# Patient Record
Sex: Male | Born: 1960 | Race: White | Hispanic: No | Marital: Married | Smoking: Never smoker
Health system: Southern US, Community
[De-identification: ages and names within clinical notes are randomized; demographics above are authoritative.]

## PROBLEM LIST (undated history)

## (undated) DIAGNOSIS — E119 Type 2 diabetes mellitus without complications: Secondary | ICD-10-CM

## (undated) DIAGNOSIS — E785 Hyperlipidemia, unspecified: Secondary | ICD-10-CM

## (undated) DIAGNOSIS — I10 Essential (primary) hypertension: Secondary | ICD-10-CM

## (undated) HISTORY — DX: Type 2 diabetes mellitus without complications: E11.9

## (undated) HISTORY — PX: NO PAST SURGERIES: SHX2092

## (undated) HISTORY — DX: Essential (primary) hypertension: I10

## (undated) HISTORY — DX: Hyperlipidemia, unspecified: E78.5

---

## 2015-05-08 ENCOUNTER — Ambulatory Visit (INDEPENDENT_AMBULATORY_CARE_PROVIDER_SITE_OTHER): Payer: Self-pay

## 2015-05-08 ENCOUNTER — Ambulatory Visit
Admission: EM | Admit: 2015-05-08 | Discharge: 2015-05-08 | Disposition: A | Discharge status: Home / Self Care | Payer: Self-pay | Attending: Emergency Medicine | Admitting: Emergency Medicine

## 2015-05-08 ENCOUNTER — Encounter: Payer: Self-pay | Admitting: *Deleted

## 2015-05-08 DIAGNOSIS — L03032 Cellulitis of left toe: Secondary | ICD-10-CM

## 2015-05-08 MED ORDER — CEFTRIAXONE SODIUM 1 G IJ SOLR
1.0000 g | Freq: Once | INTRAMUSCULAR | Status: AC
  Administered 2015-05-08: 1 g via INTRAMUSCULAR

## 2015-05-08 MED ORDER — SULFAMETHOXAZOLE-TRIMETHOPRIM 800-160 MG PO TABS: 2.0000 | ORAL_TABLET | Freq: Two times a day (BID) | ORAL | Status: DC

## 2015-05-08 NOTE — Discharge Instructions (Signed)
Follow-up here in 2 days for wound recheck. You need to be seen by a primary care physician. I'm concerned about your blood pressure today, and I'm concerned that you may have diabetes. Go to the ER if you get worse, for pain, for any black discoloration, for odor, if you start having fevers, or other concerns

## 2015-05-08 NOTE — ED Notes (Signed)
Patient reports that a blister developed on his left big toe on 05/03/15. After the blister burst the area around the big toe has become swollen, red, and with drainage present. Patient states that he has not been to a PCP in 20 years.

## 2015-05-08 NOTE — ED Provider Notes (Signed)
HPI  SUBJECTIVE:  [name] [name] is a 54 y.o. male who presents with a blister on the plantar aspect at the base of his first left toe starting 6 days ago. States that it burst, and then was followed by a erythema, edema, mild pain. States that the redness and swelling was extending up to his mid foot several days ago, but has gotten better with some rest. He reports clear yellow drainage. Reports second blister on the dorsum of his toe several days ago. Symptoms are better with rest, worse with walking. He tried antibiotic ointment, peroxide, Epsom salt soaks. No nausea, vomiting, fevers, trauma to the area, paresthesias. He reports 1 day of chills for days ago, this has since resolved. Denies body aches, odor coming from the wound. No antipyretic in the past 4-6 hours. Patient denies any history of diabetes, hypertension. He is not a smoker.   History reviewed. No pertinent past medical history.  History reviewed. No pertinent past surgical history.  History reviewed. No pertinent family history.  Social History  Substance Use Topics  . Smoking status: Never Smoker   . Smokeless tobacco: Never Used  . Alcohol Use: Yes    No current facility-administered medications for this encounter.  Current outpatient prescriptions:  .  sulfamethoxazole-trimethoprim (BACTRIM DS,SEPTRA DS) 800-160 MG tablet, Take 2 tablets by mouth 2 (two) times daily., Disp: 40 tablet, Rfl: 0  No Known Allergies   ROS  As noted in HPI.   Physical Exam  BP 177/97 mmHg  Pulse 95  Temp(Src) 97.4 F (36.3 C) (Oral)  Resp 18  Ht 6\' 1"  (1.854 m)  Wt 275 lb (124.739 kg)  BMI 36.29 kg/m2  SpO2 100%  Constitutional: Well developed, well nourished, no acute distress Eyes:  EOMI, conjunctiva normal bilaterally HENT: Normocephalic, atraumatic,mucus membranes moist Respiratory: Normal inspiratory effort Cardiovascular: Normal rate GI: nondistended skin: No rash, skin intact Musculoskeletal: Erythema, edema  extending to the first MTP. Mild diffuse tenderness. No bony tenderness. 2 lesions 1.5 x 1 cm nontender blister on the plantar surface of his foot, 3 x 1 cm nontender blister on the dorsum at the PIP. 2 point discrimination intact and equal in all toes. DP 2+.  Neurologic: Alert & oriented x 3, no focal neuro deficits Psychiatric: Speech and behavior appropriate   ED Course   Medications  cefTRIAXone (ROCEPHIN) injection 1 g (1 g Intramuscular Given 05/08/15 1523)    Orders Placed This Encounter  Procedures  . DG Toe Great Left    Standing Status: Standing     Number of Occurrences: 1     Standing Expiration Date:     Order Specific Question:  Reason for Exam (SYMPTOM  OR DIAGNOSIS REQUIRED)    Answer:  r/o subcu air, osteomyelitis    No results found for this or any previous visit (from the past 24 hour(s)). Dg Toe Great Left  05/08/2015  CLINICAL DATA:  Possible subcutaneous air, osteomyelitis great toe EXAM: LEFT GREAT TOE COMPARISON:  None. FINDINGS: Four views of the left first second and third toes submitted. No acute fracture or subluxation. There is diffuse soft tissue swelling left great toe. No bone destruction or bony erosion to suggest osteomyelitis. No periosteal reaction. No radiopaque foreign body. No definite evidence of subcutaneous air. IMPRESSION: No acute fracture or subluxation. No definite evidence of osteomyelitis. Electronically Signed   By: Natasha MeadLiviu  Pop M.D.   On: 05/08/2015 15:53          ED Clinical Impression  Cellulitis  of toe of left foot   ED Assessment/Plan Obtaining x-ray to rule out osteomyelitis. Giving 1 g Rocephin. Marked area of erythema with marker for reference. Will have patient return in 48 hours for wound recheck. Consider sending to the ER if not improving significantly on reevaluation. Home on Bactrim. Will give patient primary care referrals for follow-up. Giving strict ER return precautions.   Reviewed  imaging independently. No  subcutaneous air, osteomyelitis.  See radiology report for full details.  Discussed imaging, MDM, plan and followup with patient. Discussed sn/sx that should prompt return to the UC or ED. Patient agrees with plan.   *This clinic note was created using Dragon dictation software. Therefore, there may be occasional mistakes despite careful proofreading.  ?   Domenick Gong, MD 05/09/15 [phone]

## 2015-05-26 ENCOUNTER — Encounter: Payer: Self-pay | Admitting: Family Medicine

## 2015-05-26 ENCOUNTER — Ambulatory Visit (INDEPENDENT_AMBULATORY_CARE_PROVIDER_SITE_OTHER): Payer: Self-pay | Admitting: Family Medicine

## 2015-05-26 VITALS — BP 169/109 | HR 91 | Ht 73.0 in | Wt 251.0 lb

## 2015-05-26 DIAGNOSIS — N529 Male erectile dysfunction, unspecified: Secondary | ICD-10-CM | POA: Insufficient documentation

## 2015-05-26 DIAGNOSIS — I10 Essential (primary) hypertension: Secondary | ICD-10-CM

## 2015-05-26 DIAGNOSIS — E11621 Type 2 diabetes mellitus with foot ulcer: Secondary | ICD-10-CM

## 2015-05-26 DIAGNOSIS — E785 Hyperlipidemia, unspecified: Secondary | ICD-10-CM

## 2015-05-26 DIAGNOSIS — IMO0002 Reserved for concepts with insufficient information to code with codable children: Secondary | ICD-10-CM

## 2015-05-26 DIAGNOSIS — L97521 Non-pressure chronic ulcer of other part of left foot limited to breakdown of skin: Secondary | ICD-10-CM

## 2015-05-26 DIAGNOSIS — L97509 Non-pressure chronic ulcer of other part of unspecified foot with unspecified severity: Secondary | ICD-10-CM

## 2015-05-26 DIAGNOSIS — L97529 Non-pressure chronic ulcer of other part of left foot with unspecified severity: Secondary | ICD-10-CM | POA: Insufficient documentation

## 2015-05-26 DIAGNOSIS — E1165 Type 2 diabetes mellitus with hyperglycemia: Secondary | ICD-10-CM

## 2015-05-26 DIAGNOSIS — Z23 Encounter for immunization: Secondary | ICD-10-CM

## 2015-05-26 LAB — GLUCOSE, POCT (MANUAL RESULT ENTRY): POC GLUCOSE: 206 mg/dL — AB (ref 70–99)

## 2015-05-26 MED ORDER — METFORMIN HCL 500 MG PO TABS: 500.0000 mg | ORAL_TABLET | Freq: Two times a day (BID) | ORAL | Status: DC

## 2015-05-26 MED ORDER — LISINOPRIL 5 MG PO TABS: 5.0000 mg | ORAL_TABLET | Freq: Every day | ORAL | Status: DC

## 2015-05-26 NOTE — Progress Notes (Signed)
Date:  05/26/2015   Name:  [name] DukeJames [name]   DOB:  08-15-1960   MRN:  [id number]  PCP:  No PCP Per Patient    Chief Complaint: New Evaluation and Hypertension   History of Present Illness:  This is a 55 y.o. male with L great toe ulcer/cellulitis seen [facility] two weeks ago and treated with Rocephin and Bactrim, toe much improved, dorsal ulcer healed, plantar ulcer healing, pain resolved. [facility] 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 Also c/o ED past year, wants medication. Father with HTN. Takes Aleve prn back pain but not regularly. Takes B complex and [facility] Nugenix daily. Tetanus status unknown, no flu imm this year.  Review of Systems:  Review of Systems  Constitutional: Negative for fever and fatigue.  HENT: Negative for ear pain, sore throat and trouble swallowing.   Eyes: Negative for pain.  Respiratory: Negative for cough and shortness of breath.   Cardiovascular: Negative for chest pain and leg swelling.  Gastrointestinal: Negative for abdominal pain.  Endocrine: Negative for polyuria.  Genitourinary: Negative for difficulty urinating.  Neurological: Negative for syncope and light-headedness.    Patient Active Problem List   Diagnosis Date Noted  . Hypertension 05/26/2015  . Erectile dysfunction 05/26/2015  . Foot ulcer, left (HCC) 05/26/2015    Prior to Admission medications   Medication Sig Start Date End Date Taking? Authorizing Provider  b complex vitamins tablet Take 1 tablet by mouth daily.   Yes Historical Provider, MD  naproxen sodium (ANAPROX) 220 MG tablet Take 220 mg by mouth 2 (two) times daily as needed.   Yes Historical Provider, MD  lisinopril (PRINIVIL,ZESTRIL) 5 MG tablet Take 1 tablet (5 mg total) by mouth daily. 05/26/15   Schuyler AmorWilliam Zanetta Dehaan, MD  metFORMIN (GLUCOPHAGE) 500 MG tablet Take 1 tablet (500 mg total) by mouth 2 (two) times daily with a meal. 05/26/15   Schuyler AmorWilliam Fabienne Nolasco, MD    No Known  Allergies  Past Surgical History  Procedure Laterality Date  . No past surgeries      Social History  Substance Use Topics  . Smoking status: Never Smoker   . Smokeless tobacco: Never Used  . Alcohol Use: 0.0 oz/week    0 Standard drinks or equivalent per week     Comment: occasionally    Family History  Problem Relation Age of Onset  . Hypertension Father     Medication list has been reviewed and updated.  Physical Examination: BP 169/109 mmHg  Pulse 91  Ht 6\' 1"  (1.854 m)  Wt 251 lb (113.853 kg)  BMI 33.12 kg/m2  Physical Exam  Constitutional: He is oriented to person, place, and time. He appears well-developed and well-nourished.  HENT:  Head: Normocephalic and atraumatic.  Right Ear: External ear normal.  Left Ear: External ear normal.  Nose: Nose normal.  Mouth/Throat: Oropharynx is clear and moist.  TM's clear  Eyes: Conjunctivae and EOM are normal. Pupils are equal, round, and reactive to light.  Neck: Neck supple. No thyromegaly present.  Cardiovascular: Normal rate, regular rhythm and normal heart sounds.   Pulmonary/Chest: Effort normal and breath sounds normal.  Abdominal: Soft. He exhibits no distension and no mass. There is no tenderness.  Genitourinary: Penis normal.  Testes normal, no hernias noted, normal pubic hair distribution  Musculoskeletal: He exhibits no edema.  Lymphadenopathy:    He has no cervical adenopathy.  Neurological: He is alert and oriented  to person, place, and time. Coordination normal.  Skin: Skin is warm and dry.  L great toe with healing 1 cm ulcer plantar aspect, no surrounding erythema/edema/warmth noted  Psychiatric: He has a normal mood and affect. His behavior is normal.  Nursing note and vitals reviewed.   Assessment and Plan:  1. Foot ulcer, left, limited to breakdown of skin (HCC) Healing well, advised pt to monitor daily, call if worsens - POCT Glucose (CBG)  2. 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 Essential hypertension Poor control, begin lisinopril, consider CMP/CBC next visit  4. Erectile dysfunction, unspecified erectile dysfunction type Likely due to DM, consider Viagra if persists with improved diabetic control  5. Need for influenza vaccination - Flu Vaccine QUAD 36+ mos PF IM (Fluarix & Fluzone Quad PF)  6. Need for tetanus booster - Tdap vaccine greater than or equal to 7yo IM  Return in about 4 weeks (around 06/23/2015).  [name]. [name] MD [facility]  05/26/2015

## 2015-05-27 LAB — LIPID PANEL
Chol/HDL Ratio: 4.9 ratio units (ref 0.0–5.0)
Cholesterol, Total: 241 mg/dL — ABNORMAL HIGH (ref 100–199)
HDL: 49 mg/dL (ref >39)
LDL CALC: 170 mg/dL — AB (ref 0–99)
Triglycerides: 112 mg/dL (ref 0–149)
VLDL CHOLESTEROL CAL: 22 mg/dL (ref 5–40)

## 2015-05-27 LAB — HEMOGLOBIN A1C
ESTIMATED AVERAGE GLUCOSE: 272 mg/dL
Hgb A1c MFr Bld: 11.1 % — ABNORMAL HIGH (ref 4.8–5.6)

## 2015-05-30 ENCOUNTER — Telehealth: Payer: Self-pay

## 2015-05-30 DIAGNOSIS — E785 Hyperlipidemia, unspecified: Secondary | ICD-10-CM | POA: Insufficient documentation

## 2015-05-30 DIAGNOSIS — IMO0002 Reserved for concepts with insufficient information to code with codable children: Secondary | ICD-10-CM | POA: Insufficient documentation

## 2015-05-30 DIAGNOSIS — L97509 Non-pressure chronic ulcer of other part of unspecified foot with unspecified severity: Secondary | ICD-10-CM

## 2015-05-30 DIAGNOSIS — E11621 Type 2 diabetes mellitus with foot ulcer: Secondary | ICD-10-CM | POA: Insufficient documentation

## 2015-05-30 DIAGNOSIS — E1165 Type 2 diabetes mellitus with hyperglycemia: Secondary | ICD-10-CM

## 2015-05-30 MED ORDER — SIMVASTATIN 40 MG PO TABS: 40.0000 mg | ORAL_TABLET | Freq: Every day | ORAL | Status: DC

## 2015-05-30 MED ORDER — ASPIRIN 81 MG PO TABS: 81.0000 mg | ORAL_TABLET | Freq: Every day | ORAL | Status: AC

## 2015-05-30 NOTE — Telephone Encounter (Signed)
Spoke with pt. Pt. Advised of results and verbalized understanding. Will be going by the pharmacy to start medication today.

## 2015-05-30 NOTE — Addendum Note (Signed)
Addended by: Schuyler AmorPLONK, Somaly Marteney on: 05/30/2015 12:50 PM   Modules accepted: Orders, SmartSet

## 2015-05-30 NOTE — Telephone Encounter (Signed)
-----   Message from Schuyler AmorWilliam Plonk, MD sent at 05/30/2015 12:47 PM EST ----- Inform blood work shows uncontrolled diabetes and high cholesterol, recommend begin simvastatin 40 mg daily (rx sent) and OTC aspirin 81 mg daily.

## 2015-06-26 ENCOUNTER — Ambulatory Visit (INDEPENDENT_AMBULATORY_CARE_PROVIDER_SITE_OTHER): Payer: Self-pay | Admitting: Family Medicine

## 2015-06-26 ENCOUNTER — Encounter: Payer: Self-pay | Admitting: Family Medicine

## 2015-06-26 VITALS — BP 154/94 | HR 104 | Ht 73.0 in | Wt 254.0 lb

## 2015-06-26 DIAGNOSIS — I10 Essential (primary) hypertension: Secondary | ICD-10-CM

## 2015-06-26 DIAGNOSIS — E1165 Type 2 diabetes mellitus with hyperglycemia: Secondary | ICD-10-CM

## 2015-06-26 DIAGNOSIS — E785 Hyperlipidemia, unspecified: Secondary | ICD-10-CM

## 2015-06-26 DIAGNOSIS — E11621 Type 2 diabetes mellitus with foot ulcer: Secondary | ICD-10-CM

## 2015-06-26 DIAGNOSIS — Z23 Encounter for immunization: Secondary | ICD-10-CM

## 2015-06-26 DIAGNOSIS — IMO0002 Reserved for concepts with insufficient information to code with codable children: Secondary | ICD-10-CM

## 2015-06-26 DIAGNOSIS — N529 Male erectile dysfunction, unspecified: Secondary | ICD-10-CM

## 2015-06-26 DIAGNOSIS — L97521 Non-pressure chronic ulcer of other part of left foot limited to breakdown of skin: Secondary | ICD-10-CM

## 2015-06-26 DIAGNOSIS — L97509 Non-pressure chronic ulcer of other part of unspecified foot with unspecified severity: Secondary | ICD-10-CM

## 2015-06-26 MED ORDER — LISINOPRIL 10 MG PO TABS: 10.0000 mg | ORAL_TABLET | Freq: Every day | ORAL | Status: DC

## 2015-06-26 NOTE — Progress Notes (Signed)
Date:  06/26/2015   Name:  [name] [name]   DOB:  1960-06-14   MRN:  [id number]  PCP:  No PCP Per Patient    Chief Complaint: Follow-up and Hypertension   History of Present Illness:  This is a 55 y.o. male with newly dx'd T2DM, started on metformin last month (a1c 11.1%), some initial nausea but better now, home BG's 201 yest and 191 today. Feeling well, L foot ulcer nearly healed. HTN started on lisinopril last month. HLD started on Zocor and asa last month. Has been on NCS diet and is losing weight. Needs Pneumovax and MCR.  Review of Systems:  Review of Systems  Constitutional: Negative for fever.  Respiratory: Negative for shortness of breath.   Cardiovascular: Negative for chest pain and leg swelling.  Endocrine: Negative for polyuria.  Genitourinary: Negative for difficulty urinating.  Neurological: Negative for syncope and light-headedness.    Patient Active Problem List   Diagnosis Date Noted  . Hyperlipidemia 05/30/2015  . Uncontrolled type 2 diabetes mellitus with foot ulcer, without long-term current use of insulin (HCC) 05/30/2015  . Hypertension 05/26/2015  . Erectile dysfunction 05/26/2015  . Foot ulcer, left (HCC) 05/26/2015    Prior to Admission medications   Medication Sig Start Date End Date Taking? Authorizing Provider  aspirin 81 MG tablet Take 1 tablet (81 mg total) by mouth daily. 05/30/15  Yes Schuyler Amor, MD  b complex vitamins tablet Take 1 tablet by mouth daily.   Yes Historical Provider, MD  lisinopril (PRINIVIL,ZESTRIL) 10 MG tablet Take 1 tablet (10 mg total) by mouth daily. 06/26/15  Yes Schuyler Amor, MD  metFORMIN (GLUCOPHAGE) 500 MG tablet Take 1 tablet (500 mg total) by mouth 2 (two) times daily with a meal. 05/26/15  Yes Schuyler Amor, MD  naproxen sodium (ANAPROX) 220 MG tablet Take 220 mg by mouth 2 (two) times daily as needed.   Yes Historical Provider, MD  simvastatin (ZOCOR) 40 MG tablet Take 1 tablet (40 mg total) by mouth at bedtime. 05/30/15   Yes Schuyler Amor, MD    No Known Allergies  Past Surgical History  Procedure Laterality Date  . No past surgeries      Social History  Substance Use Topics  . Smoking status: Never Smoker   . Smokeless tobacco: Never Used  . Alcohol Use: 0.0 oz/week    0 Standard drinks or equivalent per week     Comment: occasionally    Family History  Problem Relation Age of Onset  . Hypertension Father     Medication list has been reviewed and updated.  Physical Examination: BP 154/94 mmHg  Pulse 104  Ht  (1.854 m)  Wt 254 lb (115.214 kg)  BMI 33.52 kg/m2  Physical Exam  Constitutional: He appears well-developed and well-nourished.  Cardiovascular: Normal rate, regular rhythm and normal heart sounds.   Pulmonary/Chest: Effort normal and breath sounds normal.  Musculoskeletal: He exhibits no edema.  L great toe superior ulcer fully healed, inferior ulcer with healing scab, no sign surrounding erythema  Neurological: He is alert.  Skin: Skin is warm and dry.  Psychiatric: He has a normal mood and affect. His behavior is normal.  Nursing note and vitals reviewed.   Assessment and Plan:  1. Uncontrolled type 2 diabetes mellitus with foot ulcer, without long-term current use of insulin (HCC) Improved, continue metformin - Urine Microalbumin w/creat. ratio  2. Essential hypertension Improved but marginal control, increase lisinopril to 10 mg daily - Comprehensive Metabolic Panel (  CMET)  3. Foot ulcer, left, limited to breakdown of skin (HCC) Healing well  4. Hyperlipidemia On Zocor, recheck lipids - Lipid Profile  5. Erectile dysfunction, unspecified erectile dysfunction type Address next visit if unimproved with diabetes control  6. Need for pneumococcal vaccination Pneumovax today  Return in about 2 months (around 08/24/2015).  [name]. [name] MD [facility]  06/26/2015

## 2015-06-27 ENCOUNTER — Telehealth: Payer: Self-pay

## 2015-06-27 LAB — LIPID PANEL
CHOLESTEROL TOTAL: 141 mg/dL (ref 100–199)
Chol/HDL Ratio: 3.6 ratio units (ref 0.0–5.0)
HDL: 39 mg/dL — ABNORMAL LOW (ref >39)
LDL Calculated: 69 mg/dL (ref 0–99)
Triglycerides: 165 mg/dL — ABNORMAL HIGH (ref 0–149)
VLDL Cholesterol Cal: 33 mg/dL (ref 5–40)

## 2015-06-27 LAB — COMPREHENSIVE METABOLIC PANEL
ALBUMIN: 4.9 g/dL (ref 3.5–5.5)
ALK PHOS: 64 IU/L (ref 39–117)
ALT: 20 IU/L (ref 0–44)
AST: 15 IU/L (ref 0–40)
Albumin/Globulin Ratio: 1.9 (ref 1.1–2.5)
BUN / CREAT RATIO: 17 (ref 9–20)
BUN: 16 mg/dL (ref 6–24)
Bilirubin Total: 0.5 mg/dL (ref 0.0–1.2)
CALCIUM: 10 mg/dL (ref 8.7–10.2)
CO2: 24 mmol/L (ref 18–29)
CREATININE: 0.94 mg/dL (ref 0.76–1.27)
Chloride: 97 mmol/L (ref 96–106)
GFR calc Af Amer: 106 mL/min/{1.73_m2} (ref >59)
GFR, EST NON AFRICAN AMERICAN: 92 mL/min/{1.73_m2} (ref >59)
GLOBULIN, TOTAL: 2.6 g/dL (ref 1.5–4.5)
GLUCOSE: 177 mg/dL — AB (ref 65–99)
Potassium: 4.1 mmol/L (ref 3.5–5.2)
SODIUM: 142 mmol/L (ref 134–144)
TOTAL PROTEIN: 7.5 g/dL (ref 6.0–8.5)

## 2015-06-27 LAB — MICROALBUMIN / CREATININE URINE RATIO
CREATININE, UR: 150.6 mg/dL
MICROALB/CREAT RATIO: 16.3 mg/g creat (ref 0.0–30.0)
Microalbumin, Urine: 24.6 ug/mL

## 2015-06-27 NOTE — Telephone Encounter (Signed)
Spoke with patient. Patient advised of all results and verbalized understanding. Will call back with any future questions or concerns. MAH  

## 2015-06-27 NOTE — Telephone Encounter (Signed)
-----   Message from Schuyler Amor, MD sent at 06/27/2015  1:36 PM EST ----- Inform lipid levels much improved on simvastatin and urine protein levels normal, recommend continue current therapy.

## 2015-06-27 NOTE — Telephone Encounter (Signed)
Left message for patient to call back  

## 2015-07-24 ENCOUNTER — Other Ambulatory Visit: Payer: Self-pay | Admitting: Family Medicine

## 2015-07-24 MED ORDER — METFORMIN HCL 500 MG PO TABS: 500.0000 mg | ORAL_TABLET | Freq: Two times a day (BID) | ORAL | Status: DC

## 2015-08-24 ENCOUNTER — Other Ambulatory Visit: Payer: Self-pay | Admitting: Family Medicine

## 2015-08-24 MED ORDER — SIMVASTATIN 40 MG PO TABS: 40.0000 mg | ORAL_TABLET | Freq: Every day | ORAL | Status: DC

## 2015-08-25 ENCOUNTER — Ambulatory Visit (INDEPENDENT_AMBULATORY_CARE_PROVIDER_SITE_OTHER): Payer: Self-pay | Admitting: Family Medicine

## 2015-08-25 ENCOUNTER — Encounter: Payer: Self-pay | Admitting: Family Medicine

## 2015-08-25 VITALS — BP 120/80 | HR 78 | Ht 73.0 in | Wt 241.0 lb

## 2015-08-25 DIAGNOSIS — L97509 Non-pressure chronic ulcer of other part of unspecified foot with unspecified severity: Secondary | ICD-10-CM

## 2015-08-25 DIAGNOSIS — I10 Essential (primary) hypertension: Secondary | ICD-10-CM

## 2015-08-25 DIAGNOSIS — IMO0002 Reserved for concepts with insufficient information to code with codable children: Secondary | ICD-10-CM

## 2015-08-25 DIAGNOSIS — E669 Obesity, unspecified: Secondary | ICD-10-CM

## 2015-08-25 DIAGNOSIS — E66811 Obesity, class 1: Secondary | ICD-10-CM | POA: Insufficient documentation

## 2015-08-25 DIAGNOSIS — E11621 Type 2 diabetes mellitus with foot ulcer: Secondary | ICD-10-CM

## 2015-08-25 DIAGNOSIS — E785 Hyperlipidemia, unspecified: Secondary | ICD-10-CM

## 2015-08-25 DIAGNOSIS — E1165 Type 2 diabetes mellitus with hyperglycemia: Secondary | ICD-10-CM

## 2015-08-25 DIAGNOSIS — N529 Male erectile dysfunction, unspecified: Secondary | ICD-10-CM

## 2015-08-25 MED ORDER — SIMVASTATIN 40 MG PO TABS: 40.0000 mg | ORAL_TABLET | Freq: Every day | ORAL | Status: DC

## 2015-08-25 NOTE — Progress Notes (Signed)
Date:  08/25/2015   Name:  [name] [name]   DOB:  01/29/1961   MRN:  [id number]  PCP:  No PCP Per Patient    Chief Complaint: Diabetes; Hyperlipidemia; and Hypertension   History of Present Illness:  This is a 55 y.o. male seen for two month f/u. Feels well.  Dx'd DM in January, on metformin, losing weight (13# since last visit), home BG's around 150. On Zocor, LDL 69 last visit. On increased lisinopril for HTN, MCR ok. Still having some ED but thinks better. Foot ulcer healed, checking feet daily.  Review of Systems:  Review of Systems  Constitutional: Negative for fever and fatigue.  Respiratory: Negative for cough and shortness of breath.   Cardiovascular: Negative for chest pain and leg swelling.  Endocrine: Negative for polyuria.  Genitourinary: Negative for difficulty urinating.  Neurological: Negative for syncope and light-headedness.    Patient Active Problem List   Diagnosis Date Noted  . Hyperlipidemia 05/30/2015  . Uncontrolled type 2 diabetes mellitus with foot ulcer, without long-term current use of insulin (HCC) 05/30/2015  . Hypertension 05/26/2015  . Erectile dysfunction 05/26/2015    Prior to Admission medications   Medication Sig Start Date End Date Taking? Authorizing Provider  aspirin 81 MG tablet Take 1 tablet (81 mg total) by mouth daily. 05/30/15  Yes Schuyler AmorWilliam Tishie Altmann, MD  b complex vitamins tablet Take 1 tablet by mouth daily.   Yes Historical Provider, MD  lisinopril (PRINIVIL,ZESTRIL) 10 MG tablet Take 1 tablet (10 mg total) by mouth daily. 06/26/15  Yes Schuyler AmorWilliam Madalaine Portier, MD  metFORMIN (GLUCOPHAGE) 500 MG tablet Take 1 tablet (500 mg total) by mouth 2 (two) times daily with a meal. 07/24/15  Yes Schuyler AmorWilliam Yochanan Eddleman, MD  simvastatin (ZOCOR) 40 MG tablet Take 1 tablet (40 mg total) by mouth at bedtime. 08/25/15  Yes Schuyler AmorWilliam Mahdi Frye, MD    No Known Allergies  Past Surgical History  Procedure Laterality Date  . No past surgeries      Social History  Substance Use Topics   . Smoking status: Never Smoker   . Smokeless tobacco: Never Used  . Alcohol Use: 0.0 oz/week    0 Standard drinks or equivalent per week     Comment: occasionally    Family History  Problem Relation Age of Onset  . Hypertension Father     Medication list has been reviewed and updated.  Physical Examination: BP 120/80 mmHg  Pulse 78  Ht 6\' 1"  (1.854 m)  Wt 241 lb (109.317 kg)  BMI 31.80 kg/m2  Physical Exam  Constitutional: He appears well-developed and well-nourished.  Cardiovascular: Normal rate, regular rhythm and normal heart sounds.   Pulmonary/Chest: Effort normal and breath sounds normal.  Musculoskeletal: He exhibits no edema.  Neurological: He is alert.  Skin: Skin is warm and dry.  Psychiatric: He has a normal mood and affect. His behavior is normal.  Nursing note and vitals reviewed.   Assessment and Plan:  1. Uncontrolled type 2 diabetes mellitus with foot ulcer, without long-term current use of insulin (HCC) Unclear control on metformin - HgB A1c  2. Essential hypertension Well controlled on lisinopril  3. Hyperlipidemia Well controlled on Zocor  4. Erectile dysfunction, unspecified erectile dysfunction type Improved with diabetic control, consider Viagra if sxs persist  5. Obesity, Class I, BMI 30-34.9 Encouraged continued weight loss  Return in about 3 months (around 11/24/2015).  Dionne AnoWilliam M. Kingsley SpittlePlonk, Jr. MD [facility]  08/25/2015

## 2015-08-26 LAB — HEMOGLOBIN A1C
Est. average glucose Bld gHb Est-mCnc: 186 mg/dL
Hgb A1c MFr Bld: 8.1 % — ABNORMAL HIGH (ref 4.8–5.6)

## 2015-08-28 MED ORDER — METFORMIN HCL 1000 MG PO TABS: 1000.0000 mg | ORAL_TABLET | Freq: Two times a day (BID) | ORAL | Status: AC

## 2015-08-28 NOTE — Addendum Note (Signed)
Addended by: Schuyler AmorPLONK, Olubunmi Rothenberger on: 08/28/2015 09:14 AM   Modules accepted: Orders

## 2015-09-25 ENCOUNTER — Other Ambulatory Visit: Payer: Self-pay | Admitting: Family Medicine

## 2015-09-25 MED ORDER — LISINOPRIL 10 MG PO TABS: 10.0000 mg | ORAL_TABLET | Freq: Every day | ORAL | Status: AC

## 2018-03-17 ENCOUNTER — Encounter: Payer: Self-pay | Attending: Physician Assistant | Admitting: Physician Assistant

## 2018-03-17 DIAGNOSIS — I10 Essential (primary) hypertension: Secondary | ICD-10-CM | POA: Insufficient documentation

## 2018-03-17 DIAGNOSIS — E1140 Type 2 diabetes mellitus with diabetic neuropathy, unspecified: Secondary | ICD-10-CM | POA: Insufficient documentation

## 2018-03-17 DIAGNOSIS — Z7984 Long term (current) use of oral hypoglycemic drugs: Secondary | ICD-10-CM | POA: Insufficient documentation

## 2018-03-17 DIAGNOSIS — L97422 Non-pressure chronic ulcer of left heel and midfoot with fat layer exposed: Secondary | ICD-10-CM | POA: Insufficient documentation

## 2018-03-17 DIAGNOSIS — E11621 Type 2 diabetes mellitus with foot ulcer: Secondary | ICD-10-CM | POA: Insufficient documentation

## 2018-03-21 NOTE — Progress Notes (Signed)
[name], [name] ([id number]) Visit Report for 03/17/2018 Allergy List Details Patient Name: [name] [name] Date of Service: 03/17/2018 8:45 AM Medical Record Number: [id number] Patient Account Number: [id number] Date of Birth/Sex: [birth year] (57 y.o. Male) Treating RN: Huel Coventry Primary Care [name] [name]: Dala Dock Other Clinician: Referring Tmya Wigington: Dala Dock Treating Josseline Reddin/Extender: STONE III, HOYT Weeks in Treatment: 0 Allergies Active Allergies No Known Drug Allergies Allergy Notes Electronic Signature(s) Signed: 03/19/2018 7:26:08 AM By: Elliot Gurney, BSN, RN, CWS, Kim RN, BSN Entered By: Elliot Gurney, BSN, RN, CWS, Kim on 03/17/2018 09:09:18 [name] [name] ([id number]) -------------------------------------------------------------------------------- Arrival Information Details Patient Name: [name] [name] Date of Service: 03/17/2018 8:45 AM Medical Record Number: [id number] Patient Account Number: [id number] Date of Birth/Sex: 1960-11-08 (57 y.o. Male) Treating RN: Huel Coventry Primary Care Kyshon Tolliver: Dala Dock Other Clinician: Referring Atleigh Gruen: Dala Dock Treating Kenitra Leventhal/Extender: Linwood Dibbles, HOYT Weeks in Treatment: 0 Visit Information Patient Arrived: Ambulatory Arrival Time: 09:05 Accompanied By: self Transfer Assistance: None Patient Identification Verified: Yes Secondary Verification Process Yes Completed: Patient Has Alerts: Yes Patient Alerts: Patient on Blood Thinner Type II Diabetic Aspirin 81mg  Electronic Signature(s) Signed: 03/19/2018 7:26:08 AM By: Elliot Gurney, BSN, RN, CWS, Kim RN, BSN Entered By: Elliot Gurney, BSN, RN, CWS, Kim on 03/17/2018 09:05:55 [name] [name] ([id number]) -------------------------------------------------------------------------------- Clinic Level of Care Assessment Details Patient Name: [name] [name] Date of Service: 03/17/2018 8:45 AM Medical Record Number: [id number] Patient Account Number: [id number] Date of Birth/Sex: 1960/12/03 (57  y.o. Male) Treating RN: Curtis Sites Primary Care Mikylah Ackroyd: Dala Dock Other Clinician: Referring Addelynn Batte: Dala Dock Treating Kensy Blizard/Extender: STONE III, HOYT Weeks in Treatment: 0 Clinic Level of Care Assessment Items TOOL 1 Quantity Score []  - Use when EandM and Procedure is performed on INITIAL visit 0 ASSESSMENTS - Nursing Assessment / Reassessment X - General Physical Exam (combine w/ comprehensive assessment (listed just below) when 1 20 performed on new pt. evals) X- 1 25 Comprehensive Assessment (HX, ROS, Risk Assessments, Wounds Hx, etc.) ASSESSMENTS - Wound and Skin Assessment / Reassessment []  - Dermatologic / Skin Assessment (not related to wound area) 0 ASSESSMENTS - Ostomy and/or Continence Assessment and Care []  - Incontinence Assessment and Management 0 []  - 0 Ostomy Care Assessment and Management (repouching, etc.) PROCESS - Coordination of Care X - Simple Patient / Family Education for ongoing care 1 15 []  - 0 Complex (extensive) Patient / Family Education for ongoing care X- 1 10 Staff obtains Chiropractor, Records, Test Results / Process Orders []  - 0 Staff telephones HHA, Nursing Homes / Clarify orders / etc []  - 0 Routine Transfer to another Facility (non-emergent condition) []  - 0 Routine Hospital Admission (non-emergent condition) X- 1 15 New Admissions / Manufacturing engineer / Ordering NPWT, Apligraf, etc. []  - 0 Emergency Hospital Admission (emergent condition) PROCESS - Special Needs []  - Pediatric / Minor Patient Management 0 []  - 0 Isolation Patient Management []  - 0 Hearing / Language / Visual special needs []  - 0 Assessment of Community assistance (transportation, D/C planning, etc.) []  - 0 Additional assistance / Altered mentation []  - 0 Support Surface(s) Assessment (bed, cushion, seat, etc.) Locker, Fayrene Fearing ([id number]) INTERVENTIONS - Miscellaneous []  - External ear exam 0 []  - 0 Patient Transfer (multiple staff / Water quality scientist / Similar devices) []  - 0 Simple Staple / Suture removal (25 or less) []  - 0 Complex Staple / Suture removal (26 or more) []  - 0 Hypo/Hyperglycemic Management (do not check if billed separately) X- 1 15 Ankle / Brachial Index (ABI) - do not check if billed separately  Has the patient been seen at the hospital within the last three years: Yes Total Score: 100 Level Of Care: New/Established - Level 3 Electronic Signature(s) Signed: 03/17/2018 5:02:25 PM By: Curtis Sites Entered By: Curtis Sites on 03/17/2018 10:22:33 [name] [name] ([id number]) -------------------------------------------------------------------------------- Encounter Discharge Information Details Patient Name: [name] [name] Date of Service: 03/17/2018 8:45 AM Medical Record Number: [id number] Patient Account Number: [id number] Date of Birth/Sex: Oct 31, 1960 (57 y.o. Male) Treating RN: Huel Coventry Primary Care Renelle Stegenga: Dala Dock Other Clinician: Referring Armel Rabbani: Dala Dock Treating Eliseo Withers/Extender: STONE III, HOYT Weeks in Treatment: 0 Encounter Discharge Information Items Post Procedure Vitals Discharge Condition: Stable Temperature (F): 98.2 Ambulatory Status: Ambulatory Pulse (bpm): 75 Discharge Destination: Home Respiratory Rate (breaths/min): 16 Transportation: Private Auto Blood Pressure (mmHg): 157/75 Accompanied By: self Schedule Follow-up Appointment: Yes Clinical Summary of Care: Electronic Signature(s) Signed: 03/19/2018 7:26:08 AM By: Elliot Gurney, BSN, RN, CWS, Kim RN, BSN Entered By: Elliot Gurney, BSN, RN, CWS, Kim on 03/17/2018 10:25:04 [name] [name] ([id number]) -------------------------------------------------------------------------------- Lower Extremity Assessment Details Patient Name: [name] [name] Date of Service: 03/17/2018 8:45 AM Medical Record Number: [id number] Patient Account Number: [id number] Date of Birth/Sex: 02/16/1961 (57 y.o. Male) Treating RN: Huel Coventry Primary  Care Billye Pickerel: Dala Dock Other Clinician: Referring Kariah Loredo: Dala Dock Treating Aemilia Dedrick/Extender: STONE III, HOYT Weeks in Treatment: 0 Edema Assessment Assessed: [Left: No] [Right: No] Edema: [Left: No] [Right: No] Vascular Assessment Pulses: Dorsalis Pedis Palpable: [Left:Yes] [Right:Yes] Posterior Tibial Palpable: [Left:Yes] [Right:Yes] Extremity colors, hair growth, and conditions: Extremity Color: [Left:Normal] [Right:Normal] Hair Growth on Extremity: [Left:Yes] [Right:Yes] Temperature of Extremity: [Left:Warm] [Right:Warm] Capillary Refill: [Left:< 3 seconds] [Right:< 3 seconds] Blood Pressure: Brachial: [Left:152] [Right:132] Dorsalis Pedis: 142 [Left:Dorsalis Pedis: 152] Ankle: Posterior Tibial: 170 [Left:Posterior Tibial: 162 1.12] [Right:1.07] Toe Nail Assessment Left: Right: Thick: No No Discolored: No Yes Deformed: No No Improper Length and Hygiene: No No Electronic Signature(s) Signed: 03/19/2018 7:26:08 AM By: Elliot Gurney, BSN, RN, CWS, Kim RN, BSN Entered By: Elliot Gurney, BSN, RN, CWS, Kim on 03/17/2018 09:36:29 [name] [name] ([id number]) -------------------------------------------------------------------------------- Multi Wound Chart Details Patient Name: [name] [name] Date of Service: 03/17/2018 8:45 AM Medical Record Number: [id number] Patient Account Number: [id number] Date of Birth/Sex: 03/31/1961 (57 y.o. Male) Treating RN: Curtis Sites Primary Care Thy Gullikson: Dala Dock Other Clinician: Referring Sherleen Pangborn: Dala Dock Treating Lovelle Lema/Extender: STONE III, HOYT Weeks in Treatment: 0 Vital Signs Height(in): 73 Pulse(bpm): 75 Weight(lbs): 267.7 Blood Pressure(mmHg): 157/75 Body Mass Index(BMI): 35 Temperature(F): 98.2 Respiratory Rate 16 (breaths/min): Photos: [1:No Photos] [2:No Photos] [N/A:N/A] Wound Location: [1:Left Foot - Plantar] [2:Left Toe Great - Plantar] [N/A:N/A] Wounding Event: [1:Trauma] [2:Gradually Appeared]  [N/A:N/A] Primary Etiology: [1:Diabetic Wound/Ulcer of the Lower Extremity] [2:Diabetic Wound/Ulcer of the Lower Extremity] [N/A:N/A] Comorbid History: [1:Hypertension, Type II Diabetes, Osteoarthritis, Neuropathy] [2:Hypertension, Type II Diabetes, Osteoarthritis, Neuropathy] [N/A:N/A] Date Acquired: [1:09/01/2017] [2:08/18/2017] [N/A:N/A] Weeks of Treatment: [1:0] [2:0] [N/A:N/A] Wound Status: [1:Open] [2:Open] [N/A:N/A] Measurements L x W x D [1:2.5x1.6x0.8] [2:0.4x0.8x0.1] [N/A:N/A] (cm) Area (cm) : [1:3.142] [2:0.251] [N/A:N/A] Volume (cm) : [1:2.513] [2:0.025] [N/A:N/A] Starting Position 1 [1:9] (o'clock): Ending Position 1 [1:6] (o'clock): Maximum Distance 1 (cm): [1:0.4] Undermining: [1:Yes] [2:No] [N/A:N/A] Classification: [1:Grade 2] [2:Unable to visualize wound bed N/A] Exudate Amount: [1:Medium] [2:None Present] [N/A:N/A] Exudate Type: [1:Serous] [2:N/A] [N/A:N/A] Exudate Color: [1:amber] [2:N/A] [N/A:N/A] Wound Margin: [1:Flat and Intact] [2:Flat and Intact] [N/A:N/A] Granulation Amount: [1:Medium (34-66%)] [2:None Present (0%)] [N/A:N/A] Necrotic Amount: [1:Medium (34-66%)] [2:Large (67-100%)] [N/A:N/A] Necrotic Tissue: [1:Adherent Slough] [2:Eschar] [N/A:N/A] Exposed Structures: [1:Fat Layer (Subcutaneous Tissue) Exposed: Yes Fascia: No Tendon: No Muscle: No  Joint: No Bone: No] [2:Fascia: No Fat Layer (Subcutaneous Tissue) Exposed: No Tendon: No Muscle: No Joint: No Bone: No] [N/A:N/A] Epithelialization: [1:None] [2:None] [N/A:N/A] Periwound Skin Texture: Callus: Yes No Abnormalities Noted N/A Periwound Skin Moisture: No Abnormalities Noted No Abnormalities Noted N/A Periwound Skin Color: No Abnormalities Noted No Abnormalities Noted N/A Temperature: No Abnormality N/A N/A Tenderness on Palpation: No No N/A Wound Preparation: Ulcer Cleansing: Ulcer Cleansing: N/A Rinsed/Irrigated with Saline Rinsed/Irrigated with Saline Topical Anesthetic Applied: Topical  Anesthetic Applied: Other: lidocaine 4% None Treatment Notes Electronic Signature(s) Signed: 03/17/2018 5:02:25 PM By: Curtis Sites Entered By: Curtis Sites on 03/17/2018 10:02:45 [name] [name] ([id number]) -------------------------------------------------------------------------------- Multi-Disciplinary Care Plan Details Patient Name: [name] [name] Date of Service: 03/17/2018 8:45 AM Medical Record Number: [id number] Patient Account Number: [id number] Date of Birth/Sex: 17-Jul-1960 (57 y.o. Male) Treating RN: Curtis Sites Primary Care Tyce Delcid: Dala Dock Other Clinician: Referring Aizen Duval: Dala Dock Treating Capone Schwinn/Extender: STONE III, HOYT Weeks in Treatment: 0 Active Inactive ` Abuse / Safety / Falls / Self Care Management Nursing Diagnoses: Potential for falls Goals: Patient will not develop complications from immobility Date Initiated: 03/17/2018 Target Resolution Date: 05/23/2018 Goal Status: Active Interventions: Assess fall risk on admission and as needed Notes: ` Orientation to the Wound Care Program Nursing Diagnoses: Knowledge deficit related to the wound healing center program Goals: Patient/caregiver will verbalize understanding of the Wound Healing Center Program Date Initiated: 03/17/2018 Target Resolution Date: 05/23/2018 Goal Status: Active Interventions: Provide education on orientation to the wound center Notes: ` Wound/Skin Impairment Nursing Diagnoses: Impaired tissue integrity Goals: Ulcer/skin breakdown will heal within 14 weeks Date Initiated: 03/17/2018 Target Resolution Date: 05/23/2018 Goal Status: Active Interventions: JEHIEL, KOEPP ([id number]) Assess patient/caregiver ability to obtain necessary supplies Assess patient/caregiver ability to perform ulcer/skin care regimen upon admission and as needed Assess ulceration(s) every visit Notes: Electronic Signature(s) Signed: 03/17/2018 5:02:25 PM By: Curtis Sites Entered  By: Curtis Sites on 03/17/2018 10:02:27 [name] [name] ([id number]) -------------------------------------------------------------------------------- Pain Assessment Details Patient Name: [name] [name] Date of Service: 03/17/2018 8:45 AM Medical Record Number: [id number] Patient Account Number: [id number] Date of Birth/Sex: [birth year] (57 y.o. Male) Treating RN: Huel Coventry Primary Care Faust Thorington: Dala Dock Other Clinician: Referring Lucio Litsey: Dala Dock Treating Ira Busbin/Extender: STONE III, HOYT Weeks in Treatment: 0 Active Problems Location of Pain Severity and Description of Pain Patient Has Paino No Site Locations Pain Management and Medication Current Pain Management: Electronic Signature(s) Signed: 03/19/2018 7:26:08 AM By: Elliot Gurney, BSN, RN, CWS, Kim RN, BSN Entered By: Elliot Gurney, BSN, RN, CWS, Kim on 03/17/2018 09:06:03 [name] [name] ([id number]) -------------------------------------------------------------------------------- Patient/Caregiver Education Details Patient Name: [name] [name] Date of Service: 03/17/2018 8:45 AM Medical Record Number: [id number] Patient Account Number: [id number] Date of Birth/Gender: 13-Sep-1960 (57 y.o. Male) Treating RN: Curtis Sites Primary Care Physician: Dala Dock Other Clinician: Referring Physician: Dala Dock Treating Physician/Extender: Linwood Dibbles, HOYT Weeks in Treatment: 0 Education Assessment Education Provided To: Patient Education Topics Provided Wound Debridement: Handouts: Other: debridement by Leonard Schwartz Methods: Explain/Verbal Responses: State content correctly Wound/Skin Impairment: Handouts: Other: wound care as ordered Methods: Demonstration, Explain/Verbal Responses: State content correctly Electronic Signature(s) Signed: 03/17/2018 5:02:25 PM By: Curtis Sites Entered By: Curtis Sites on 03/17/2018 10:23:04 [name] [name]  ([id number]) -------------------------------------------------------------------------------- Wound Assessment Details Patient Name: [name] [name] Date of Service: 03/17/2018 8:45 AM Medical Record Number: [id number] Patient Account Number: [id number] Date of Birth/Sex: 1960-06-11 (57 y.o. Male) Treating RN: Huel Coventry Primary Care Rand Etchison: Dala Dock Other Clinician: Referring Gracielynn Birkel: Dala Dock Treating Dondrell Loudermilk/Extender: STONE III, HOYT Weeks in Treatment: 0 Wound  Status Wound Number: 1 Primary Diabetic Wound/Ulcer of the Lower Extremity Etiology: Wound Location: Left Foot - Plantar Wound Status: Open Wounding Event: Trauma Comorbid Hypertension, Type II Diabetes, Date Acquired: 09/01/2017 History: Osteoarthritis, Neuropathy Weeks Of Treatment: 0 Clustered Wound: No Photos Photo Uploaded By: Elliot Gurney, BSN, RN, CWS, Kim on 03/17/2018 16:26:46 Wound Measurements Length: (cm) 2.5 % Reducti Width: (cm) 1.6 % Reducti Depth: (cm) 0.8 Epithelia Area: (cm) 3.142 Tunnelin Volume: (cm) 2.513 Undermin Starti Ending Maximu on in Area: on in Volume: lization: None g: No ing: Yes ng Position (o'clock): 9 Position (o'clock): 6 m Distance: (cm) 0.4 Wound Description Classification: Grade 2 Foul Odor Wound Margin: Flat and Intact Slough/Fi Exudate Amount: Medium Exudate Type: Serous Exudate Color: amber After Cleansing: No brino Yes Wound Bed Granulation Amount: Medium (34-66%) Exposed Structure Granulation Quality: Hyper-granulation Fascia Exposed: No Necrotic Amount: Medium (34-66%) Fat Layer (Subcutaneous Tissue) Exposed: Yes Necrotic Quality: Adherent Slough Tendon Exposed: No Muscle Exposed: No Murakami, Fayrene Fearing ([id number]) Joint Exposed: No Bone Exposed: No Periwound Skin Texture Texture Color No Abnormalities Noted: No No Abnormalities Noted: No Callus: Yes Temperature / Pain Moisture Temperature: No Abnormality No Abnormalities Noted: No Wound  Preparation Ulcer Cleansing: Rinsed/Irrigated with Saline Topical Anesthetic Applied: Other: lidocaine 4%, Treatment Notes Wound #1 (Left, Plantar Foot) Notes Silvercell and cover dressing Electronic Signature(s) Signed: 03/19/2018 7:26:08 AM By: Elliot Gurney, BSN, RN, CWS, Kim RN, BSN Entered By: Elliot Gurney, BSN, RN, CWS, Kim on 03/17/2018 09:23:48 [name] [name] ([id number]) -------------------------------------------------------------------------------- Wound Assessment Details Patient Name: [name] [name] Date of Service: 03/17/2018 8:45 AM Medical Record Number: [id number] Patient Account Number: [id number] Date of Birth/Sex: 1960/09/11 (57 y.o. Male) Treating RN: Huel Coventry Primary Care Leatha Rohner: Dala Dock Other Clinician: Referring Mattye Verdone: Dala Dock Treating Zabella Wease/Extender: STONE III, HOYT Weeks in Treatment: 0 Wound Status Wound Number: 2 Primary Diabetic Wound/Ulcer of the Lower Extremity Etiology: Wound Location: Left Toe Great - Plantar Wound Status: Open Wounding Event: Gradually Appeared Comorbid Hypertension, Type II Diabetes, Date Acquired: 08/18/2017 History: Osteoarthritis, Neuropathy Weeks Of Treatment: 0 Clustered Wound: No Photos Photo Uploaded By: Elliot Gurney, BSN, RN, CWS, Kim on 03/17/2018 16:27:56 Wound Measurements Length: (cm) 0.4 Width: (cm) 0.8 Depth: (cm) 0.1 Area: (cm) 0.251 Volume: (cm) 0.025 % Reduction in Area: 0% % Reduction in Volume: 0% Epithelialization: None Tunneling: No Undermining: No Wound Description Classification: Unable to visualize wound bed Wound Margin: Flat and Intact Exudate Amount: None Present Foul Odor After Cleansing: No Slough/Fibrino No Wound Bed Granulation Amount: None Present (0%) Exposed Structure Necrotic Amount: Large (67-100%) Fascia Exposed: No Necrotic Quality: Eschar Fat Layer (Subcutaneous Tissue) Exposed: No Tendon Exposed: No Muscle Exposed: No Joint Exposed: No Bone Exposed: No Periwound  Skin Texture Texture Color No Abnormalities Noted: No No Abnormalities Noted: No [place], [place] ([id number]) Moisture No Abnormalities Noted: No Wound Preparation Ulcer Cleansing: Rinsed/Irrigated with Saline Topical Anesthetic Applied: None Assessment Notes Patient does not want this area touched. He states is has been closed up for about a year and he wants to keep it that way. Theus Espin Notified. Electronic Signature(s) Signed: 03/19/2018 7:26:08 AM By: Elliot Gurney, BSN, RN, CWS, Kim RN, BSN Entered By: Elliot Gurney, BSN, RN, CWS, Kim on 03/17/2018 10:23:49 [name] [name] ([id number]) -------------------------------------------------------------------------------- Vitals Details Patient Name: [name] [name] Date of Service: 03/17/2018 8:45 AM Medical Record Number: [id number] Patient Account Number: [id number] Date of Birth/Sex: [birth year] (57 y.o. Male) Treating RN: Huel Coventry Primary Care Leveda Kendrix: Dala Dock Other Clinician: Referring Isham Smitherman: Dala Dock Treating Kendalynn Wideman/Extender: STONE III, HOYT Weeks in Treatment: 0 Vital  Signs Time Taken: 09:06 Temperature (F): 98.2 Height (in): 73 Pulse (bpm): 75 Weight (lbs): 267.7 Respiratory Rate (breaths/min): 16 Body Mass Index (BMI): 35.3 Blood Pressure (mmHg): 157/75 Reference Range: 80 - 120 mg / dl Electronic Signature(s) Signed: 03/19/2018 7:26:08 AM By: Elliot Gurney, BSN, RN, CWS, Kim RN, BSN Entered By: Elliot Gurney, BSN, RN, CWS, Kim on 03/17/2018 09:07:07

## 2018-03-21 NOTE — Progress Notes (Signed)
[name] [name], [name] [name] ([id number]) Visit Report for 03/17/2018 Abuse/Suicide Risk Screen Details Patient Name: [name] [name], [name] [name] Date of Service: 03/17/2018 8:45 AM Medical Record Number: [id number] Patient Account Number: [id number] Date of Birth/Sex: January 12, 1961 (57 y.o. Male) Treating RN: Huel Coventry Primary Care Tyjai Charbonnet: Dala Dock Other Clinician: Referring Aundre Hietala: Dala Dock Treating Anndrea Mihelich/Extender: STONE III, HOYT Weeks in Treatment: 0 Abuse/Suicide Risk Screen Items Answer ABUSE/SUICIDE RISK SCREEN: Has anyone close to you tried to hurt or harm you recentlyo No Do you feel uncomfortable with anyone in your familyo No Has anyone forced you do things that you didnot want to doo No Do you have any thoughts of harming yourselfo No Patient displays signs or symptoms of abuse and/or neglect. No Electronic Signature(s) Signed: 03/19/2018 7:26:08 AM By: Elliot Gurney, BSN, RN, CWS, Kim RN, BSN Entered By: Elliot Gurney, BSN, RN, CWS, Kim on 03/17/2018 09:15:57 [name] [name] ([id number]) -------------------------------------------------------------------------------- Activities of Daily Living Details Patient Name: [name] [name] Date of Service: 03/17/2018 8:45 AM Medical Record Number: [id number] Patient Account Number: [id number] Date of Birth/Sex: 01-13-1961 (57 y.o. Male) Treating RN: Huel Coventry Primary Care Naba Sneed: Dala Dock Other Clinician: Referring Reka Wist: Dala Dock Treating Ambree Frances/Extender: STONE III, HOYT Weeks in Treatment: 0 Activities of Daily Living Items Answer Activities of Daily Living (Please select one for each item) Drive Automobile Completely Able Take Medications Completely Able Use Telephone Completely Able Care for Appearance Completely Able Use Toilet Completely Able Bath / Shower Completely Able Dress Self Completely Able Feed Self Completely Able Walk Completely Able Get In / Out Bed Completely Able Housework Completely Able Prepare Meals  Completely Able Handle Money Completely Able Shop for Self Completely Able Electronic Signature(s) Signed: 03/19/2018 7:26:08 AM By: Elliot Gurney, BSN, RN, CWS, Kim RN, BSN Entered By: Elliot Gurney, BSN, RN, CWS, Kim on 03/17/2018 09:16:06 [name] [name] ([id number]) -------------------------------------------------------------------------------- Education Assessment Details Patient Name: [name] [name] Date of Service: 03/17/2018 8:45 AM Medical Record Number: [id number] Patient Account Number: [id number] Date of Birth/Sex: March 09, 1961 (57 y.o. Male) Treating RN: Huel Coventry Primary Care Ninnie Fein: Dala Dock Other Clinician: Referring Haivyn Oravec: Dala Dock Treating Allexus Ovens/Extender: STONE III, HOYT Weeks in Treatment: 0 Primary Learner Assessed: Patient Learning Preferences/Education Level/Primary Language Learning Preference: Demonstration Highest Education Level: High School Preferred Language: English Cognitive Barrier Assessment/Beliefs Language Barrier: No Translator Needed: No Memory Deficit: No Emotional Barrier: No Cultural/Religious Beliefs Affecting Medical Care: No Physical Barrier Assessment Impaired Vision: Yes Glasses, readers Impaired Hearing: No Decreased Hand dexterity: No Knowledge/Comprehension Assessment Knowledge Level: High Comprehension Level: High Ability to understand written High instructions: Ability to understand verbal High instructions: Motivation Assessment Anxiety Level: Calm Cooperation: Cooperative Education Importance: Acknowledges Need Interest in Health Problems: Asks Questions Perception: Coherent Willingness to Engage in Self- High Management Activities: Readiness to Engage in Self- High Management Activities: Psychologist, prison and probation services) Signed: 03/19/2018 7:26:08 AM By: Elliot Gurney, BSN, RN, CWS, Kim RN, BSN Entered By: Elliot Gurney, BSN, RN, CWS, Kim on 03/17/2018 09:16:38 [name] [name]  ([id number]) -------------------------------------------------------------------------------- Fall Risk Assessment Details Patient Name: [name] [name] Date of Service: 03/17/2018 8:45 AM Medical Record Number: [id number] Patient Account Number: [id number] Date of Birth/Sex: February 13, 1961 (57 y.o. Male) Treating RN: Huel Coventry Primary Care Louis Gaw: Dala Dock Other Clinician: Referring Rolonda Pontarelli: Dala Dock Treating Keandra Medero/Extender: STONE III, HOYT Weeks in Treatment: 0 Fall Risk Assessment Items Have you had 2 or more falls in the last 12 monthso 0 No Have you had any fall that resulted in injury in the last 12 monthso 0 No FALL RISK ASSESSMENT: History of falling - immediate or within 3 months  0 No Secondary diagnosis 0 No Ambulatory aid None/bed rest/wheelchair/nurse 0 Yes Crutches/cane/walker 0 No Furniture 0 No IV Access/Saline Lock 0 No Gait/Training Normal/bed rest/immobile 0 Yes Weak 0 No Impaired 0 No Mental Status Oriented to own ability 0 Yes Electronic Signature(s) Signed: 03/19/2018 7:26:08 AM By: Elliot Gurney, BSN, RN, CWS, Kim RN, BSN Entered By: Elliot Gurney, BSN, RN, CWS, Kim on 03/17/2018 09:16:48 [name] [name] ([id number]) -------------------------------------------------------------------------------- Foot Assessment Details Patient Name: [name] [name] Date of Service: 03/17/2018 8:45 AM Medical Record Number: [id number] Patient Account Number: [id number] Date of Birth/Sex: [birth year] (57 y.o. Male) Treating RN: Huel Coventry Primary Care Gizzelle Lacomb: Dala Dock Other Clinician: Referring Waris Rodger: Dala Dock Treating Eusebia Grulke/Extender: STONE III, HOYT Weeks in Treatment: 0 Foot Assessment Items Site Locations + = Sensation present, - = Sensation absent, C = Callus, U = Ulcer R = Redness, W = Warmth, M = Maceration, PU = Pre-ulcerative lesion F = Fissure, S = Swelling, D = Dryness Assessment Right: Left: Other Deformity: No No Prior Foot Ulcer: No No Prior  Amputation: No No Charcot Joint: No No Ambulatory Status: Ambulatory Without Help Gait: Steady Electronic Signature(s) Signed: 03/19/2018 7:26:08 AM By: Elliot Gurney, BSN, RN, CWS, Kim RN, BSN Entered By: Elliot Gurney, BSN, RN, CWS, Kim on 03/17/2018 09:18:43 [name] [name] ([id number]) -------------------------------------------------------------------------------- Nutrition Risk Assessment Details Patient Name: [name] [name] Date of Service: 03/17/2018 8:45 AM Medical Record Number: [id number] Patient Account Number: [id number] Date of Birth/Sex: 06-Dec-1960 (57 y.o. Male) Treating RN: Huel Coventry Primary Care Robertlee Rogacki: Dala Dock Other Clinician: Referring Rogina Schiano: Dala Dock Treating Anitria Andon/Extender: STONE III, HOYT Weeks in Treatment: 0 Height (in): 73 Weight (lbs): 267.7 Body Mass Index (BMI): 35.3 Nutrition Risk Assessment Items NUTRITION RISK SCREEN: I have an illness or condition that made me change the kind and/or amount of 0 No food I eat I eat fewer than two meals per day 0 No I eat few fruits and vegetables, or milk products 0 No I have three or more drinks of beer, liquor or wine almost every day 0 No I have tooth or mouth problems that make it hard for me to eat 0 No I don't always have enough money to buy the food I need 0 No I eat alone most of the time 0 No I take three or more different prescribed or over-the-counter drugs a day 1 Yes Without wanting to, I have lost or gained 10 pounds in the last six months 0 No I am not always physically able to shop, cook and/or feed myself 0 No Nutrition Protocols Good Risk Protocol 0 No interventions needed Moderate Risk Protocol Electronic Signature(s) Signed: 03/19/2018 7:26:08 AM By: Elliot Gurney, BSN, RN, CWS, Kim RN, BSN Entered By: Elliot Gurney, BSN, RN, CWS, Kim on 03/17/2018 09:17:07

## 2018-03-21 NOTE — Progress Notes (Signed)
KALIEF, KATTNER ([id number]) Visit Report for 03/17/2018 Chief Complaint Document Details Patient Name: [name], [name] Date of Service: 03/17/2018 8:45 AM Medical Record Number: [id number] Patient Account Number: [id number] Date of Birth/Sex: 1960-08-24 (57 y.o. Male) Treating RN: Curtis Sites Primary Care Provider: Dala Dock Other Clinician: Referring Provider: Dala Dock Treating Provider/Extender: Linwood Dibbles, HOYT Weeks in Treatment: 0 Information Obtained from: Patient Chief Complaint Left foot ulcers Electronic Signature(s) Signed: 03/18/2018 1:21:52 AM By: Lenda Kelp PA-C Entered By: Lenda Kelp on 03/17/2018 09:54:38 [name] [name] ([id number]) -------------------------------------------------------------------------------- Debridement Details Patient Name: [name] [name] Date of Service: 03/17/2018 8:45 AM Medical Record Number: [id number] Patient Account Number: [id number] Date of Birth/Sex: June 30, 1960 (57 y.o. Male) Treating RN: Curtis Sites Primary Care Provider: Dala Dock Other Clinician: Referring Provider: Dala Dock Treating Provider/Extender: STONE III, HOYT Weeks in Treatment: 0 Debridement Performed for Wound #1 Left,Plantar Foot Assessment: Performed By: Physician STONE III, HOYT E., PA-C Debridement Type: Debridement Severity of Tissue Pre Fat layer exposed Debridement: Level of Consciousness (Pre- Awake and Alert procedure): Pre-procedure Verification/Time Yes - 10:04 Out Taken: Start Time: 10:04 Pain Control: Lidocaine 4% Topical Solution Total Area Debrided (L x W): 2.5 (cm) x 1.6 (cm) = 4 (cm) Tissue and other material Viable, Non-Viable, Callus, Slough, Subcutaneous, Slough debrided: Level: Skin/Subcutaneous Tissue Debridement Description: Excisional Instrument: Curette Bleeding: Minimum Hemostasis Achieved: Pressure End Time: 10:14 Procedural Pain: 0 Post Procedural Pain: 0 Response to Treatment: Procedure was  tolerated well Level of Consciousness Awake and Alert (Post-procedure): Post Debridement Measurements of Total Wound Length: (cm) 2.6 Width: (cm) 1.8 Depth: (cm) 0.5 Volume: (cm) 1.838 Character of Wound/Ulcer Post Debridement: Improved Severity of Tissue Post Debridement: Fat layer exposed Post Procedure Diagnosis Same as Pre-procedure Electronic Signature(s) Signed: 03/17/2018 5:02:25 PM By: Curtis Sites Signed: 03/18/2018 1:21:52 AM By: Lenda Kelp PA-C Entered By: Curtis Sites on 03/17/2018 10:15:19 [name] [name] ([id number]) -------------------------------------------------------------------------------- HPI Details Patient Name: [name] [name] Date of Service: 03/17/2018 8:45 AM Medical Record Number: [id number] Patient Account Number: [id number] Date of Birth/Sex: 03/19/1961 (57 y.o. Male) Treating RN: Curtis Sites Primary Care Provider: Dala Dock Other Clinician: Referring Provider: Dala Dock Treating Provider/Extender: STONE III, HOYT Weeks in Treatment: 0 History of Present Illness HPI Description: 03/17/18 on evaluation today patient is sent to [place] by way of his primary care provider at [facility] for an evaluation regarding his thoughts left plantar foot ulcer. Patient has previously undergone x-ray of his left foot on 02/06/18 which showed evidence of intra-articular osseous erosions of the first distal phalanx base in the first proximal phalanx head which are in proximity to a plantar skin and soft tissue ulcer. 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 The patient also has a second ulcer in which is more on the plantar surface of his foot which is actually open at this point apparently the toe wound has been closed for number of years. Nonetheless for the past six months he's been dealing with the plantar foot wound and unfortunately it does not  seem to be improving significantly. He does have diabetes mellitus type II and this last hemoglobin A1c was 7.3 on 01/15/18. He does have a referral as well to see vascular at [facility]. Nonetheless he has not had this appointment as of yet. In fact he states they called him to schedule this when he was driving down the road they were supposed to call him back since he could  make the schedule at that point being that he was driving and he has not heard back from them yet. Nonetheless the patient states he doesn't have any pain secondary to neuropathy which is at least good news. No fevers, chills, nausea, or vomiting noted at this time. He states that in regard to the toe he really is not concerned about this with the wound since it has been closed for a couple of years at this point and has not really given him any trouble nonetheless his x-ray would reveal seemingly that he does have osteomyelitis at the site and my concern is it may be spreading to the plantar surface of his foot leading to the ulcerations and nonhealing wound that he has at that site. Electronic Signature(s) Signed: 03/18/2018 1:21:52 AM By: Lenda Kelp PA-C Entered By: Lenda Kelp on 03/18/2018 01:17:32 [name] [name] ([id number]) -------------------------------------------------------------------------------- Physical Exam Details Patient Name: [name] [name] Date of Service: 03/17/2018 8:45 AM Medical Record Number: [id number] Patient Account Number: [id number] Date of Birth/Sex: 03/18/1961 (57 y.o. Male) Treating RN: Curtis Sites Primary Care Provider: Dala Dock Other Clinician: Referring Provider: Dala Dock Treating Provider/Extender: STONE III, HOYT Weeks in Treatment: 0 Constitutional patient is hypertensive.. pulse regular and within target range for patient.Marland Kitchen respirations regular, non-labored and within target range for patient.Marland Kitchen temperature within target range for patient.. Well-nourished  and well-hydrated in no acute distress. Eyes conjunctiva clear no eyelid edema noted. pupils equal round and reactive to light and accommodation. Ears, Nose, Mouth, and Throat no gross abnormality of ear auricles or external auditory canals. normal hearing noted during conversation. mucus membranes moist. Respiratory normal breathing without difficulty. clear to auscultation bilaterally. Cardiovascular regular rate and rhythm with normal S1, S2. 2+ dorsalis pedis/posterior tibialis pulses. no clubbing, cyanosis, significant edema, <3 sec cap refill. Gastrointestinal (GI) soft, non-tender, non-distended, +BS. no ventral hernia noted. Musculoskeletal normal gait and posture. no significant deformity or arthritic changes, no loss or range of motion, no clubbing. Psychiatric this patient is able to make decisions and demonstrates good insight into disease process. Alert and Oriented x 3. pleasant and cooperative. Notes Patient's wound bed currently did require sharp debridement American to remove away slough and necrotic debris as well as callous from the surface of the wound. He actually tolerated debridement today excellent without complication post debridement the wound bed appears to be doing significantly better. Overall the patient does seem to be active he does work currently he does flooring type projects including tile. Nonetheless this also means he does a lot of walking on his foot unfortunately he has not been wearing offloading shoe I think this might be of benefit for him he tells me he does actually have one. Electronic Signature(s) Signed: 03/18/2018 1:21:52 AM By: Lenda Kelp PA-C Entered By: Lenda Kelp on 03/18/2018 01:18:26 [name] [name] ([id number]) -------------------------------------------------------------------------------- Physician Orders Details Patient Name: [name] [name] Date of Service: 03/17/2018 8:45 AM Medical Record Number: [id number] Patient  Account Number: [id number] Date of Birth/Sex: [birth year] (57 y.o. Male) Treating RN: Curtis Sites Primary Care Provider: Dala Dock Other Clinician: Referring Provider: Dala Dock Treating Provider/Extender: STONE III, HOYT Weeks in Treatment: 0 Verbal / Phone Orders: No Diagnosis Coding ICD-10 Coding Code Description E11.621 Type 2 diabetes mellitus with foot ulcer L97.522 Non-pressure chronic ulcer of other part of left foot with fat layer exposed E08.40 Diabetes mellitus due to underlying condition with diabetic neuropathy, unspecified I10 Essential (primary) hypertension Wound Cleansing Wound #1 Left,Plantar Foot o Cleanse wound with mild  soap and water o May Shower, gently pat wound dry prior to applying new dressing. Wound #2 Left,Plantar Toe Great o Cleanse wound with mild soap and water o May Shower, gently pat wound dry prior to applying new dressing. Anesthetic (add to Medication List) Wound #1 Left,Plantar Foot o Topical Lidocaine 4% cream applied to wound bed prior to debridement (In Clinic Only). Wound #2 Left,Plantar Toe Great o Topical Lidocaine 4% cream applied to wound bed prior to debridement (In Clinic Only). Primary Wound Dressing Wound #1 Left,Plantar Foot o Silver Alginate Wound #2 Left,Plantar Toe Great o Other: - open to air Secondary Dressing Wound #1 Left,Plantar Foot o Other - bandaid or coverlet or any cover bandage Dressing Change Frequency Wound #1 Left,Plantar Foot o Change dressing every day. Follow-up Appointments Wound #1 Left,Plantar Foot o Return Appointment in 1 week. KARI, KERTH ([id number]) Off-Loading Wound #1 Left,Plantar Foot o Other: - wear your offloading shoe Wound #2 Left,Plantar Toe Great o Other: - wear your offloading shoe Electronic Signature(s) Signed: 03/17/2018 5:02:25 PM By: Curtis Sites Signed: 03/18/2018 1:21:52 AM By: Lenda Kelp PA-C Entered By: Curtis Sites on  03/17/2018 10:17:46 [name] [name] ([id number]) -------------------------------------------------------------------------------- Problem List Details Patient Name: [name] [name] Date of Service: 03/17/2018 8:45 AM Medical Record Number: [id number] Patient Account Number: [id number] Date of Birth/Sex: 1961-05-08 (57 y.o. Male) Treating RN: Curtis Sites Primary Care Provider: Dala Dock Other Clinician: Referring Provider: Dala Dock Treating Provider/Extender: Linwood Dibbles, HOYT Weeks in Treatment: 0 Active Problems ICD-10 Evaluated Encounter Code Description Active Date Today Diagnosis E11.621 Type 2 diabetes mellitus with foot ulcer 03/17/2018 No Yes L97.522 Non-pressure chronic ulcer of other part of left foot with fat 03/17/2018 No Yes layer exposed E08.40 Diabetes mellitus due to underlying condition with diabetic 03/17/2018 No Yes neuropathy, unspecified I10 Essential (primary) hypertension 03/17/2018 No Yes Inactive Problems Resolved Problems Electronic Signature(s) Signed: 03/18/2018 1:21:52 AM By: Lenda Kelp PA-C Entered By: Lenda Kelp on 03/17/2018 09:54:15 [name] [name] ([id number]) -------------------------------------------------------------------------------- Progress Note Details Patient Name: [name] [name] Date of Service: 03/17/2018 8:45 AM Medical Record Number: [id number] Patient Account Number: [id number] Date of Birth/Sex: 28-Jun-1960 (57 y.o. Male) Treating RN: Curtis Sites Primary Care Provider: Dala Dock Other Clinician: Referring Provider: Dala Dock Treating Provider/Extender: STONE III, HOYT Weeks in Treatment: 0 Subjective Chief Complaint Information obtained from Patient Left foot ulcers History of Present Illness (HPI) 03/17/18 on evaluation today patient is sent to [place] by way of his primary care provider at [facility] for an evaluation regarding his thoughts left plantar foot ulcer. Patient has  previously undergone x-ray of his left foot on 02/06/18 which showed evidence of intra-articular osseous erosions of the first distal phalanx base in the first proximal phalanx head which are in proximity to a plantar skin and soft tissue ulcer. 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 The patient also has a second ulcer in which is more on the plantar surface of his foot which is actually open at this point apparently the toe wound has been closed for number of years. Nonetheless for the past six months he's been dealing with the plantar foot wound and unfortunately it does not seem to be improving significantly. He does have diabetes mellitus type II and this last hemoglobin A1c was 7.3 on 01/15/18. He does have a referral as well to see vascular at [facility]. Nonetheless he has not had this appointment as of yet.  In fact he states they called him to schedule this when he was driving down the road they were supposed to call him back since he could make the schedule at that point being that he was driving and he has not heard back from them yet. Nonetheless the patient states he doesn't have any pain secondary to neuropathy which is at least good news. No fevers, chills, nausea, or vomiting noted at this time. He states that in regard to the toe he really is not concerned about this with the wound since it has been closed for a couple of years at this point and has not really given him any trouble nonetheless his x-ray would reveal seemingly that he does have osteomyelitis at the site and my concern is it may be spreading to the plantar surface of his foot leading to the ulcerations and nonhealing wound that he has at that site. Wound History Patient presents with 1 open wound that has been present for approximately 6 months. Patient has been treating wound in the following manner: neosporin. Laboratory  tests have not been performed in the last month. Patient reportedly has not tested positive for an antibiotic resistant organism. Patient reportedly has not tested positive for osteomyelitis. Patient reportedly has not had testing performed to evaluate circulation in the legs. Patient History Information obtained from Patient. Allergies No Known Drug Allergies Family History Cancer - Father, Hypertension - Father, No family history of Diabetes, Heart Disease, Kidney Disease, Lung Disease, Seizures, Stroke, Thyroid Problems, Tuberculosis. Social History Never smoker, Marital Status - Married, Alcohol Use - Moderate, Drug Use - No History, Caffeine Use - Daily. Medical History Eyes DAVAUN, QUINTELA ([id number]) Denies history of Cataracts, Glaucoma Ear/Nose/Mouth/Throat Denies history of Chronic sinus problems/congestion, Middle ear problems Hematologic/Lymphatic Denies history of Anemia, Hemophilia, Lymphedema, Sickle Cell Disease Respiratory Denies history of Aspiration, Asthma, Chronic Obstructive Pulmonary Disease (COPD), Pneumothorax, Sleep Apnea, Tuberculosis Cardiovascular Patient has history of Hypertension Denies history of Angina, Arrhythmia, Congestive Heart Failure, Coronary Artery Disease, Deep Vein Thrombosis, Hypotension, Myocardial Infarction, Peripheral Arterial Disease, Peripheral Venous Disease, Phlebitis, Vasculitis Gastrointestinal Denies history of Cirrhosis , Colitis, Crohn s, Hepatitis A, Hepatitis B, Hepatitis C Endocrine Patient has history of Type II Diabetes Denies history of Type I Diabetes Genitourinary Denies history of End Stage Renal Disease Immunological Denies history of Lupus Erythematosus, Raynaud s, Scleroderma Integumentary (Skin) Denies history of History of Burn, History of pressure wounds Musculoskeletal Patient has history of Osteoarthritis - hands, back Denies history of Gout, Rheumatoid Arthritis, Osteomyelitis Neurologic Patient has  history of Neuropathy - Feet Denies history of Dementia, Quadriplegia, Paraplegia, Seizure Disorder Oncologic Denies history of Received Chemotherapy, Received Radiation Psychiatric Denies history of Anorexia/bulimia, Confinement Anxiety Patient is treated with Oral Agents. Blood sugar is tested. Blood sugar results noted at the following times: Dinner - 122. Review of Systems (ROS) Constitutional Symptoms (General Health) The patient has no complaints or symptoms. Eyes The patient has no complaints or symptoms. Ear/Nose/Mouth/Throat The patient has no complaints or symptoms. Hematologic/Lymphatic The patient has no complaints or symptoms. Respiratory The patient has no complaints or symptoms. Cardiovascular The patient has no complaints or symptoms. Gastrointestinal The patient has no complaints or symptoms. Endocrine Denies complaints or symptoms of Hepatitis, Thyroid disease, Polydypsia (Excessive Thirst). Genitourinary The patient has no complaints or symptoms. Immunological The patient has no complaints or symptoms. Integumentary (Skin) Complains or has symptoms of Wounds. Denies complaints or symptoms of Bleeding or bruising tendency, Breakdown, Swelling. VASILI, FOK ([id number]) Musculoskeletal The  patient has no complaints or symptoms. Neurologic The patient has no complaints or symptoms. Oncologic The patient has no complaints or symptoms. Psychiatric The patient has no complaints or symptoms. Objective Constitutional patient is hypertensive.. pulse regular and within target range for patient.Marland Kitchen respirations regular, non-labored and within target range for patient.Marland Kitchen temperature within target range for patient.. Well-nourished and well-hydrated in no acute distress. Vitals Time Taken: 9:06 AM, Height: 73 in, Weight: 267.7 lbs, BMI: 35.3, Temperature: 98.2 F, Pulse: 75 bpm, Respiratory Rate: 16 breaths/min, Blood Pressure: 157/75 mmHg. Eyes conjunctiva clear  no eyelid edema noted. pupils equal round and reactive to light and accommodation. Ears, Nose, Mouth, and Throat no gross abnormality of ear auricles or external auditory canals. normal hearing noted during conversation. mucus membranes moist. Respiratory normal breathing without difficulty. clear to auscultation bilaterally. Cardiovascular regular rate and rhythm with normal S1, S2. 2+ dorsalis pedis/posterior tibialis pulses. no clubbing, cyanosis, significant edema, Gastrointestinal (GI) soft, non-tender, non-distended, +BS. no ventral hernia noted. Musculoskeletal normal gait and posture. no significant deformity or arthritic changes, no loss or range of motion, no clubbing. Psychiatric this patient is able to make decisions and demonstrates good insight into disease process. Alert and Oriented x 3. pleasant and cooperative. General Notes: Patient's wound bed currently did require sharp debridement American to remove away slough and necrotic debris as well as callous from the surface of the wound. He actually tolerated debridement today excellent without complication post debridement the wound bed appears to be doing significantly better. Overall the patient does seem to be active he does work currently he does flooring type projects including tile. Nonetheless this also means he does a lot of walking on his foot unfortunately he has not been wearing offloading shoe I think this might be of benefit for him he tells me he does actually have one. Integumentary (Hair, Skin) Wound #1 status is Open. Original cause of wound was Trauma. The wound is located on the Left,Plantar Foot. The wound Jeanlouis, Gedalya ([id number]) measures 2.5cm length x 1.6cm width x 0.8cm depth; 3.142cm^2 area and 2.513cm^3 volume. There is Fat Layer (Subcutaneous Tissue) Exposed exposed. There is no tunneling noted, however, there is undermining starting at 9:00 and ending at 6:00 with a maximum distance of 0.4cm.  There is a medium amount of serous drainage noted. The wound margin is flat and intact. There is medium (34-66%) hyper - granulation within the wound bed. There is a medium (34-66%) amount of necrotic tissue within the wound bed including Adherent Slough. The periwound skin appearance exhibited: Callus. Periwound temperature was noted as No Abnormality. Wound #2 status is Open. Original cause of wound was Gradually Appeared. The wound is located on the [employer]. The wound measures 0.4cm length x 0.8cm width x 0.1cm depth; 0.251cm^2 area and 0.025cm^3 volume. There is no tunneling or undermining noted. There is a none present amount of drainage noted. The wound margin is flat and intact. There is no granulation within the wound bed. There is a large (67-100%) amount of necrotic tissue within the wound bed including Eschar. General Notes: Patient does not want this area touched. He states is has been closed up for about a year and he wants to keep it that way. Provider Notified. Assessment Active Problems ICD-10 Type 2 diabetes mellitus with foot ulcer Non-pressure chronic ulcer of other part of left foot with fat layer exposed Diabetes mellitus due to underlying condition with diabetic neuropathy, unspecified Essential (primary) hypertension Procedures Wound #1 Pre-procedure diagnosis of  Wound #1 is a Diabetic Wound/Ulcer of the Lower Extremity located on the Left,Plantar Foot .Severity of Tissue Pre Debridement is: Fat layer exposed. There was a Excisional Skin/Subcutaneous Tissue Debridement with a total area of 4 sq cm performed by STONE III, HOYT E., PA-C. With the following instrument(s): Curette to remove Viable and Non-Viable tissue/material. Material removed includes Callus, Subcutaneous Tissue, and Slough after achieving pain control using Lidocaine 4% Topical Solution. No specimens were taken. A time out was conducted at 10:04, prior to the start of the procedure. A  Minimum amount of bleeding was controlled with Pressure. The procedure was tolerated well with a pain level of 0 throughout and a pain level of 0 following the procedure. Post Debridement Measurements: 2.6cm length x 1.8cm width x 0.5cm depth; 1.838cm^3 volume. Character of Wound/Ulcer Post Debridement is improved. Severity of Tissue Post Debridement is: Fat layer exposed. Post procedure Diagnosis Wound #1: Same as Pre-Procedure Plan Wound Cleansing: Wound #1 Left,Plantar Foot: Cleanse wound with mild soap and water May Shower, gently pat wound dry prior to applying new dressing. ANTWANN, PREZIOSI ([id number]) Wound #2 Left,Plantar Toe Great: Cleanse wound with mild soap and water May Shower, gently pat wound dry prior to applying new dressing. Anesthetic (add to Medication List): Wound #1 Left,Plantar Foot: Topical Lidocaine 4% cream applied to wound bed prior to debridement (In Clinic Only). Wound #2 Left,Plantar Toe Great: Topical Lidocaine 4% cream applied to wound bed prior to debridement (In Clinic Only). Primary Wound Dressing: Wound #1 Left,Plantar Foot: Silver Alginate Wound #2 Left,Plantar Toe Great: Other: - open to air Secondary Dressing: Wound #1 Left,Plantar Foot: Other - bandaid or coverlet or any cover bandage Dressing Change Frequency: Wound #1 Left,Plantar Foot: Change dressing every day. Follow-up Appointments: Wound #1 Left,Plantar Foot: Return Appointment in 1 week. Off-Loading: Wound #1 Left,Plantar Foot: Other: - wear your offloading shoe Wound #2 Left,Plantar Toe Great: Other: - wear your offloading shoe At this point my suggestion is going to be that we initiate the above wound care measures for the patient at this time. With that being said I am wondering if potentially he would benefit from a referral to the [facility] he does not have insurance at this point while I am more than happy to take care of him I'm unsure if it may not be cheaper  and more cost- effective for him to go there if indeed they have some kind of program to help as far as individuals with no insurance are concerned. Possibly even a study that he could be involved in. Nonetheless this is all of the unknown to some degree I'm not sure if anything is even available in this way. Nonetheless this is something we can definitely check into. In the meantime I think that I am gonna want him to where is offloading shoe we will initiate the above wound care measures and I'll see him back in one weeks time due to cost again I may spread out the visits to every other week and see were things stand at that point. He is in agreement the plan. I did go ahead and recommend that we would really like to order an MRI of his foot. With that being said I want to check and see if there's any way that this may be able to be done more cost-effectively for the patient we're gonna check with his primary care provider. Again my concern is that I believe he may have more extensive osteomyelitis than  what would seem to be indicated by x-ray alone based on my examination today. Please see above for specific wound care orders. We will see patient for re-evaluation in 1 week(s) here in the clinic. If anything worsens or changes patient will contact our office for additional recommendations. Electronic Signature(s) Signed: 03/18/2018 1:21:52 AM By: Lenda Kelp PA-C Entered By: Lenda Kelp on 03/18/2018 01:21:24 [name] [name] ([id number]) -------------------------------------------------------------------------------- ROS/PFSH Details Patient Name: [name] [name] Date of Service: 03/17/2018 8:45 AM Medical Record Number: [id number] Patient Account Number: [id number] Date of Birth/Sex: Nov 04, 1960 (57 y.o. Male) Treating RN: Huel Coventry Primary Care Provider: Dala Dock Other Clinician: Referring Provider: Dala Dock Treating Provider/Extender: STONE III, HOYT Weeks in Treatment:  0 Information Obtained From Patient Wound History Do you currently have one or more open woundso Yes How many open wounds do you currently haveo 1 Approximately how long have you had your woundso 6 months How have you been treating your wound(s) until nowo neosporin Has your wound(s) ever healed and then re-openedo No Have you had any lab work done in the past montho No Have you tested positive for an antibiotic resistant organism (MRSA, VRE)o No Have you tested positive for osteomyelitis (bone infection)o No Have you had any tests for circulation on your legso No Endocrine Complaints and Symptoms: Negative for: Hepatitis; Thyroid disease; Polydypsia (Excessive Thirst) Medical History: Positive for: Type II Diabetes Negative for: Type I Diabetes Time with diabetes: 3 years Treated with: Oral agents Blood sugar tested every day: Yes Tested : Blood sugar testing results: Dinner: 122 Integumentary (Skin) Complaints and Symptoms: Positive for: Wounds Negative for: Bleeding or bruising tendency; Breakdown; Swelling Medical History: Negative for: History of Burn; History of pressure wounds Constitutional Symptoms (General Health) Complaints and Symptoms: No Complaints or Symptoms Eyes Complaints and Symptoms: No Complaints or Symptoms Medical History: Negative for: Cataracts; Glaucoma Bosshart, Fayrene Fearing ([id number]) Ear/Nose/Mouth/Throat Complaints and Symptoms: No Complaints or Symptoms Medical History: Negative for: Chronic sinus problems/congestion; Middle ear problems Hematologic/Lymphatic Complaints and Symptoms: No Complaints or Symptoms Medical History: Negative for: Anemia; Hemophilia; Lymphedema; Sickle Cell Disease Respiratory Complaints and Symptoms: No Complaints or Symptoms Medical History: Negative for: Aspiration; Asthma; Chronic Obstructive Pulmonary Disease (COPD); Pneumothorax; Sleep Apnea; Tuberculosis Cardiovascular Complaints and Symptoms: No  Complaints or Symptoms Medical History: Positive for: Hypertension Negative for: Angina; Arrhythmia; Congestive Heart Failure; Coronary Artery Disease; Deep Vein Thrombosis; Hypotension; Myocardial Infarction; Peripheral Arterial Disease; Peripheral Venous Disease; Phlebitis; Vasculitis Gastrointestinal Complaints and Symptoms: No Complaints or Symptoms Medical History: Negative for: Cirrhosis ; Colitis; Crohnos; Hepatitis A; Hepatitis B; Hepatitis C Genitourinary Complaints and Symptoms: No Complaints or Symptoms Medical History: Negative for: End Stage Renal Disease Immunological Complaints and Symptoms: No Complaints or Symptoms Medical History: Negative for: Lupus Erythematosus; Raynaudos; Scleroderma Musculoskeletal JAMEN, LOISEAU ([id number]) Complaints and Symptoms: No Complaints or Symptoms Medical History: Positive for: Osteoarthritis - hands, back Negative for: Gout; Rheumatoid Arthritis; Osteomyelitis Neurologic Complaints and Symptoms: No Complaints or Symptoms Medical History: Positive for: Neuropathy - Feet Negative for: Dementia; Quadriplegia; Paraplegia; Seizure Disorder Oncologic Complaints and Symptoms: No Complaints or Symptoms Medical History: Negative for: Received Chemotherapy; Received Radiation Psychiatric Complaints and Symptoms: No Complaints or Symptoms Medical History: Negative for: Anorexia/bulimia; Confinement Anxiety Immunizations Pneumococcal Vaccine: Received Pneumococcal Vaccination: No Tetanus Vaccine: Last tetanus shot: 02/18/2015 Implantable Devices Family and Social History Cancer: Yes - Father; Diabetes: No; Heart Disease: No; Hypertension: Yes - Father; Kidney Disease: No; Lung Disease: No; Seizures: No; Stroke: No; Thyroid Problems: No; Tuberculosis: No; Never  smoker; Marital Status - Married; Alcohol Use: Moderate; Drug Use: No History; Caffeine Use: Daily; Advanced Directives: No; Patient does not want information  on Advanced Directives; Living Will: No; Medical Power of Attorney: No Electronic Signature(s) Signed: 03/18/2018 1:21:52 AM By: Lenda Kelp PA-C Signed: 03/19/2018 7:26:08 AM By: Elliot Gurney, BSN, RN, CWS, Kim RN, BSN Entered By: Elliot Gurney, BSN, RN, CWS, Kim on 03/17/2018 09:15:48 [name] [name] ([id number]) -------------------------------------------------------------------------------- SuperBill Details Patient Name: [name] [name] Date of Service: 03/17/2018 Medical Record Number: [id number] Patient Account Number: [id number] Date of Birth/Sex: 1960/05/29 (57 y.o. Male) Treating RN: Curtis Sites Primary Care Provider: Dala Dock Other Clinician: Referring Provider: Dala Dock Treating Provider/Extender: STONE III, HOYT Weeks in Treatment: 0 Diagnosis Coding ICD-10 Codes Code Description E11.621 Type 2 diabetes mellitus with foot ulcer L97.522 Non-pressure chronic ulcer of other part of left foot with fat layer exposed E08.40 Diabetes mellitus due to underlying condition with diabetic neuropathy, unspecified I10 Essential (primary) hypertension Facility Procedures CPT4 Code: 40102725 Description: 99213 - WOUND CARE VISIT-LEV 3 EST PT Modifier: Quantity: 1 CPT4 Code: 36644034 Description: 11042 - DEB SUBQ TISSUE 20 SQ CM/< ICD-10 Diagnosis Description L97.522 Non-pressure chronic ulcer of other part of left foot with fat Modifier: layer exposed Quantity: 1 Physician Procedures CPT4 Code Description: 7425956 38756 - WC PHYS LEVEL 4 - NEW PT ICD-10 Diagnosis Description E11.621 Type 2 diabetes mellitus with foot ulcer L97.522 Non-pressure chronic ulcer of other part of left foot with fat E08.40 Diabetes mellitus due to  underlying condition with diabetic neu I10 Essential (primary) hypertension Modifier: 25 layer exposed ropathy, unspecif Quantity: 1 ied CPT4 Code Description: 4332951 11042 - WC PHYS SUBQ TISS 20 SQ CM ICD-10 Diagnosis Description L97.522 Non-pressure chronic  ulcer of other part of left foot with fat Modifier: layer exposed Quantity: 1 Electronic Signature(s) Signed: 03/18/2018 1:21:52 AM By: Lenda Kelp PA-C Entered By: Lenda Kelp on 03/18/2018 01:20:26

## 2018-03-24 ENCOUNTER — Encounter: Payer: Self-pay | Attending: Physician Assistant | Admitting: Physician Assistant

## 2018-03-24 DIAGNOSIS — E1151 Type 2 diabetes mellitus with diabetic peripheral angiopathy without gangrene: Secondary | ICD-10-CM | POA: Insufficient documentation

## 2018-03-24 DIAGNOSIS — E11621 Type 2 diabetes mellitus with foot ulcer: Secondary | ICD-10-CM | POA: Insufficient documentation

## 2018-03-24 DIAGNOSIS — Z8249 Family history of ischemic heart disease and other diseases of the circulatory system: Secondary | ICD-10-CM | POA: Insufficient documentation

## 2018-03-24 DIAGNOSIS — E1140 Type 2 diabetes mellitus with diabetic neuropathy, unspecified: Secondary | ICD-10-CM | POA: Insufficient documentation

## 2018-03-24 DIAGNOSIS — I252 Old myocardial infarction: Secondary | ICD-10-CM | POA: Insufficient documentation

## 2018-03-24 DIAGNOSIS — L97522 Non-pressure chronic ulcer of other part of left foot with fat layer exposed: Secondary | ICD-10-CM | POA: Insufficient documentation

## 2018-03-24 DIAGNOSIS — Z809 Family history of malignant neoplasm, unspecified: Secondary | ICD-10-CM | POA: Insufficient documentation

## 2018-03-24 DIAGNOSIS — M1990 Unspecified osteoarthritis, unspecified site: Secondary | ICD-10-CM | POA: Insufficient documentation

## 2018-03-29 NOTE — Progress Notes (Addendum)
JEFFEY, JANSSEN ([id number]) Visit Report for 03/24/2018 Arrival Information Details Patient Name: LERON, STOFFERS Date of Service: 03/24/2018 2:30 PM Medical Record Number: [id number] Patient Account Number: [id number] Date of Birth/Sex: 12-18-1960 (57 y.o. M) Treating RN: Curtis Sites Primary Care Nubia Ziesmer: Dala Dock Other Clinician: Referring Darol Cush: Dala Dock Treating Charles Niese/Extender: STONE III, HOYT Weeks in Treatment: 1 Visit Information History Since Last Visit Added or deleted any medications: No Patient Arrived: Ambulatory Any new allergies or adverse reactions: No Arrival Time: 14:34 Had a fall or experienced change in No Accompanied By: self activities of daily living that may affect Transfer Assistance: None risk of falls: Patient Identification Verified: Yes Signs or symptoms of abuse/neglect since last No Secondary Verification Process Yes visito Completed: Hospitalized since last visit: No Patient Has Alerts: Yes Implantable device outside of the clinic No Patient Alerts: Patient on Blood excluding Thinner cellular tissue based products placed in the Type II Diabetic center Aspirin 81mg  since last visit: Has Dressing in Place as Prescribed: Yes Has Footwear/Offloading in Place as Yes Prescribed: Left: Surgical Shoe with Pressure Relief Insole Pain Present Now: No Electronic Signature(s) Signed: 03/24/2018 3:19:50 PM By: Dayton Martes RCP, RRT, CHT Entered By: Dayton Martes on 03/24/2018 14:34:53 Abran Duke ([id number]) -------------------------------------------------------------------------------- Encounter Discharge Information Details Patient Name: Abran Duke Date of Service: 03/24/2018 2:30 PM Medical Record Number: [id number] Patient Account Number: [id number] Date of Birth/Sex: Dec 04, 1960 (57 y.o. M) Treating RN: Curtis Sites Primary Care Anh Bigos: Dala Dock Other Clinician: Referring Carolyn Sylvia:  Dala Dock Treating Ceazia Harb/Extender: STONE III, HOYT Weeks in Treatment: 1 Encounter Discharge Information Items Post Procedure Vitals Discharge Condition: Stable Temperature (F): 97.8 Ambulatory Status: Ambulatory Pulse (bpm): 77 Discharge Destination: Home Respiratory Rate (breaths/min): 16 Transportation: Private Auto Blood Pressure (mmHg): 151/73 Accompanied By: self Schedule Follow-up Appointment: Yes Clinical Summary of Care: Electronic Signature(s) Signed: 03/24/2018 5:21:58 PM By: Curtis Sites Entered By: Curtis Sites on 03/24/2018 15:29:28 Abran Duke ([id number]) -------------------------------------------------------------------------------- Lower Extremity Assessment Details Patient Name: Abran Duke Date of Service: 03/24/2018 2:30 PM Medical Record Number: [id number] Patient Account Number: [id number] Date of Birth/Sex: Feb 12, 1961 (57 y.o. M) Treating RN: Huel Coventry Primary Care Kelise Kuch: Dala Dock Other Clinician: Referring Semiyah Newgent: Dala Dock Treating Karyssa Amaral/Extender: STONE III, HOYT Weeks in Treatment: 1 Vascular Assessment Pulses: Dorsalis Pedis Palpable: [Left:Yes] Posterior Tibial Extremity colors, hair growth, and conditions: Extremity Color: [Left:Normal] Hair Growth on Extremity: [Left:Yes] Temperature of Extremity: [Left:Warm] Capillary Refill: [Left:< 3 seconds] Toe Nail Assessment Left: Right: Thick: Yes Discolored: No Deformed: No Improper Length and Hygiene: No Electronic Signature(s) Signed: 03/26/2018 10:02:59 AM By: Elliot Gurney, BSN, RN, CWS, Kim RN, BSN Entered By: Elliot Gurney, BSN, RN, CWS, Kim on 03/24/2018 14:50:08 Abran Duke ([id number]) -------------------------------------------------------------------------------- Multi Wound Chart Details Patient Name: Abran Duke Date of Service: 03/24/2018 2:30 PM Medical Record Number: [id number] Patient Account Number: [id number] Date of Birth/Sex: [birth year] (57 y.o.  M) Treating RN: Curtis Sites Primary Care Kazmir Oki: Dala Dock Other Clinician: Referring Ilianna Bown: Dala Dock Treating Dov Dill/Extender: STONE III, HOYT Weeks in Treatment: 1 Vital Signs Height(in): 73 Pulse(bpm): 77 Weight(lbs): 267.7 Blood Pressure(mmHg): 151/78 Body Mass Index(BMI): 35 Temperature(F): 97.8 Respiratory Rate 16 (breaths/min): Photos: [1:No Photos] [2:No Photos] [N/A:N/A] Wound Location: [1:Left Foot - Plantar] [2:Left, Plantar Toe Great] [N/A:N/A] Wounding Event: [1:Trauma] [2:Gradually Appeared] [N/A:N/A] Primary Etiology: [1:Diabetic Wound/Ulcer of the Lower Extremity] [2:Diabetic Wound/Ulcer of the Lower Extremity] [N/A:N/A] Comorbid History: [1:Hypertension, Type II Diabetes, Osteoarthritis, Neuropathy] [2:N/A] [N/A:N/A] Date Acquired: [1:09/01/2017] [2:08/18/2017] [N/A:N/A] Weeks of Treatment: [1:1] [2:1] [N/A:N/A] Wound Status: [1:Open] [2:Open] [  N/A:N/A] Measurements L x W x D [1:2x1x0.4] [2:0.6x0.8x0.1] [N/A:N/A] (cm) Area (cm) : [1:1.571] [2:0.377] [N/A:N/A] Volume (cm) : [1:0.628] [2:0.038] [N/A:N/A] % Reduction in Area: [1:50.00%] [2:-50.20%] [N/A:N/A] % Reduction in Volume: [1:75.00%] [2:-52.00%] [N/A:N/A] Starting Position 1 [1:12] (o'clock): Ending Position 1 [1:12] (o'clock): Maximum Distance 1 (cm): [1:0.4] Undermining: [1:Yes] [2:N/A] [N/A:N/A] Classification: [1:Grade 2] [2:Unable to visualize wound bed N/A] Exudate Amount: [1:Medium] [2:N/A] [N/A:N/A] Exudate Type: [1:Serous] [2:N/A] [N/A:N/A] Exudate Color: [1:amber] [2:N/A] [N/A:N/A] Wound Margin: [1:Flat and Intact] [2:N/A] [N/A:N/A] Granulation Amount: [1:Medium (34-66%)] [2:N/A] [N/A:N/A] Necrotic Amount: [1:Medium (34-66%)] [2:N/A] [N/A:N/A] Exposed Structures: [1:Fat Layer (Subcutaneous Tissue) Exposed: Yes Fascia: No Tendon: No Muscle: No Joint: No Bone: No] [2:N/A] [N/A:N/A] Epithelialization: None N/A N/A Periwound Skin Texture: Callus: Yes No Abnormalities  Noted N/A Periwound Skin Moisture: No Abnormalities Noted No Abnormalities Noted N/A Periwound Skin Color: No Abnormalities Noted No Abnormalities Noted N/A Temperature: No Abnormality N/A N/A Tenderness on Palpation: No No N/A Wound Preparation: Ulcer Cleansing: N/A N/A Rinsed/Irrigated with Saline Topical Anesthetic Applied: Other: lidocaine 4% Treatment Notes Electronic Signature(s) Signed: 03/24/2018 5:21:58 PM By: Curtis Sites Entered By: Curtis Sites on 03/24/2018 15:21:09 Abran Duke ([id number]) -------------------------------------------------------------------------------- Multi-Disciplinary Care Plan Details Patient Name: Abran Duke Date of Service: 03/24/2018 2:30 PM Medical Record Number: [id number] Patient Account Number: [id number] Date of Birth/Sex: 1960/12/23 (57 y.o. M) Treating RN: Curtis Sites Primary Care Altie Savard: Dala Dock Other Clinician: Referring Dane Kopke: Dala Dock Treating Frandy Basnett/Extender: Linwood Dibbles, HOYT Weeks in Treatment: 1 Active Inactive Electronic Signature(s) Signed: 04/06/2018 4:37:00 PM By: Curtis Sites Previous Signature: 03/24/2018 5:21:58 PM Version By: Curtis Sites Entered By: Curtis Sites on 04/06/2018 16:36:59 Abran Duke ([id number]) -------------------------------------------------------------------------------- Pain Assessment Details Patient Name: Abran Duke Date of Service: 03/24/2018 2:30 PM Medical Record Number: [id number] Patient Account Number: [id number] Date of Birth/Sex: [birth year] (57 y.o. M) Treating RN: Curtis Sites Primary Care Jeter Tomey: Dala Dock Other Clinician: Referring Kaimani Clayson: Dala Dock Treating Kota Ciancio/Extender: STONE III, HOYT Weeks in Treatment: 1 Active Problems Location of Pain Severity and Description of Pain Patient Has Paino No Site Locations Pain Management and Medication Current Pain Management: Electronic Signature(s) Signed: 03/24/2018 3:19:50 PM By:  Sallee Provencal, RRT, CHT Signed: 03/24/2018 5:21:58 PM By: Curtis Sites Entered By: Dayton Martes on 03/24/2018 14:35:01 Abran Duke ([id number]) -------------------------------------------------------------------------------- Patient/Caregiver Education Details Patient Name: Abran Duke Date of Service: 03/24/2018 2:30 PM Medical Record Number: [id number] Patient Account Number: [id number] Date of Birth/Gender: 06/24/1960 (57 y.o. M) Treating RN: Curtis Sites Primary Care Physician: Dala Dock Other Clinician: Referring Physician: Dala Dock Treating Physician/Extender: Linwood Dibbles, HOYT Weeks in Treatment: 1 Education Assessment Education Provided To: Patient Education Topics Provided Wound/Skin Impairment: Handouts: Other: wound care as ordered Methods: Demonstration, Explain/Verbal Responses: State content correctly Electronic Signature(s) Signed: 03/24/2018 5:21:58 PM By: Curtis Sites Entered By: Curtis Sites on 03/24/2018 15:28:25 Abran Duke ([id number]) -------------------------------------------------------------------------------- Wound Assessment Details Patient Name: Abran Duke Date of Service: 03/24/2018 2:30 PM Medical Record Number: [id number] Patient Account Number: [id number] Date of Birth/Sex: [birth year] (57 y.o. M) Treating RN: Huel Coventry Primary Care Clarece Drzewiecki: Dala Dock Other Clinician: Referring Ritchard Paragas: Dala Dock Treating Myriam Brandhorst/Extender: STONE III, HOYT Weeks in Treatment: 1 Wound Status Wound Number: 1 Primary Diabetic Wound/Ulcer of the Lower Extremity Etiology: Wound Location: Left Foot - Plantar Wound Status: Open Wounding Event: Trauma Comorbid Hypertension, Type II Diabetes, Date Acquired: 09/01/2017 History: Osteoarthritis, Neuropathy Weeks Of Treatment: 1 Clustered Wound: No Photos Photo Uploaded By: Elliot Gurney, BSN, RN, CWS, Kim on 03/25/2018 07:55:03 Wound Measurements Length:  (  cm) 2 % Reducti Width: (cm) 1 % Reducti Depth: (cm) 0.4 Epithelia Area: (cm) 1.571 Tunnelin Volume: (cm) 0.628 Undermin Starti Ending Maximu on in Area: 50% on in Volume: 75% lization: None g: No ing: Yes ng Position (o'clock): 12 Position (o'clock): 12 m Distance: (cm) 0.4 Wound Description Classification: Grade 2 Foul Odor Wound Margin: Flat and Intact Slough/Fi Exudate Amount: Medium Exudate Type: Serous Exudate Color: amber After Cleansing: No brino Yes Wound Bed Granulation Amount: Medium (34-66%) Exposed Structure Granulation Quality: Hyper-granulation Fascia Exposed: No Necrotic Amount: Medium (34-66%) Fat Layer (Subcutaneous Tissue) Exposed: Yes Necrotic Quality: Adherent Slough Tendon Exposed: No Muscle Exposed: No Thome, Fayrene Fearing ([id number]) Joint Exposed: No Bone Exposed: No Periwound Skin Texture Texture Color No Abnormalities Noted: No No Abnormalities Noted: No Callus: Yes Temperature / Pain Moisture Temperature: No Abnormality No Abnormalities Noted: No Wound Preparation Ulcer Cleansing: Rinsed/Irrigated with Saline Topical Anesthetic Applied: Other: lidocaine 4%, Electronic Signature(s) Signed: 03/26/2018 10:02:59 AM By: Elliot Gurney, BSN, RN, CWS, Kim RN, BSN Entered By: Elliot Gurney, BSN, RN, CWS, Kim on 03/24/2018 14:48:16 Abran Duke ([id number]) -------------------------------------------------------------------------------- Wound Assessment Details Patient Name: Abran Duke Date of Service: 03/24/2018 2:30 PM Medical Record Number: [id number] Patient Account Number: [id number] Date of Birth/Sex: 1960/11/14 (57 y.o. M) Treating RN: Huel Coventry Primary Care Brixon Zhen: Dala Dock Other Clinician: Referring Foy Vanduyne: Dala Dock Treating Solyana Nonaka/Extender: STONE III, HOYT Weeks in Treatment: 1 Wound Status Wound Number: 2 Primary Diabetic Wound/Ulcer of the Lower Etiology: Extremity Wound Location: Left, Plantar Toe Great Wound Status:  Open Wounding Event: Gradually Appeared Date Acquired: 08/18/2017 Weeks Of Treatment: 1 Clustered Wound: No Photos Photo Uploaded By: Elliot Gurney, BSN, RN, CWS, Kim on 03/25/2018 07:55:04 Wound Measurements Length: (cm) 0.6 Width: (cm) 0.8 Depth: (cm) 0.1 Area: (cm) 0.377 Volume: (cm) 0.038 % Reduction in Area: -50.2% % Reduction in Volume: -52% Wound Description Classification: Unable to visualize wound bed Periwound Skin Texture Texture Color No Abnormalities Noted: No No Abnormalities Noted: No Moisture No Abnormalities Noted: No Electronic Signature(s) Signed: 03/26/2018 10:02:59 AM By: Elliot Gurney, BSN, RN, CWS, Kim RN, BSN Entered By: Elliot Gurney, BSN, RN, CWS, Kim on 03/24/2018 14:47:41 Abran Duke ([id number]) -------------------------------------------------------------------------------- Vitals Details Patient Name: Abran Duke Date of Service: 03/24/2018 2:30 PM Medical Record Number: [id number] Patient Account Number: [id number] Date of Birth/Sex: 03-24-1961 (57 y.o. M) Treating RN: Curtis Sites Primary Care Lars Jeziorski: Dala Dock Other Clinician: Referring Kalia Vahey: Dala Dock Treating Mali Eppard/Extender: STONE III, HOYT Weeks in Treatment: 1 Vital Signs Time Taken: 14:32 Temperature (F): 97.8 Height (in): 73 Pulse (bpm): 77 Weight (lbs): 267.7 Respiratory Rate (breaths/min): 16 Body Mass Index (BMI): 35.3 Blood Pressure (mmHg): 151/78 Reference Range: 80 - 120 mg / dl Electronic Signature(s) Signed: 03/24/2018 3:19:50 PM By: Dayton Martes RCP, RRT, CHT Entered By: Dayton Martes on 03/24/2018 14:37:10

## 2018-03-30 NOTE — Progress Notes (Signed)
HARMAN, LANGHANS ([id number]) Visit Report for 03/24/2018 Chief Complaint Document Details Patient Name: [name] [name] Date of Service: 03/24/2018 2:30 PM Medical Record Number: [id number] Patient Account Number: [id number] Date of Birth/Sex: 1960-07-19 (57 y.o. M) Treating RN: Curtis Sites Primary Care Provider: Dala Dock Other Clinician: Referring Provider: Dala Dock Treating Provider/Extender: Linwood Dibbles, HOYT Weeks in Treatment: 1 Information Obtained from: Patient Chief Complaint Left foot ulcers Electronic Signature(s) Signed: 03/27/2018 1:32:49 PM By: Lenda Kelp PA-C Entered By: Lenda Kelp on 03/24/2018 15:15:14 [name] [name] ([id number]) -------------------------------------------------------------------------------- Debridement Details Patient Name: [name] [name] Date of Service: 03/24/2018 2:30 PM Medical Record Number: [id number] Patient Account Number: [id number] Date of Birth/Sex: Sep 02, 1960 (57 y.o. M) Treating RN: Curtis Sites Primary Care Provider: Dala Dock Other Clinician: Referring Provider: Dala Dock Treating Provider/Extender: STONE III, HOYT Weeks in Treatment: 1 Debridement Performed for Wound #1 Left,Plantar Foot Assessment: Performed By: Physician STONE III, HOYT E., PA-C Debridement Type: Debridement Severity of Tissue Pre Fat layer exposed Debridement: Level of Consciousness (Pre- Awake and Alert procedure): Pre-procedure Verification/Time Yes - 15:20 Out Taken: Start Time: 15:20 Pain Control: Lidocaine 4% Topical Solution Total Area Debrided (L x W): 2 (cm) x 1 (cm) = 2 (cm) Tissue and other material Viable, Non-Viable, Callus, Slough, Subcutaneous, Slough debrided: Level: Skin/Subcutaneous Tissue Debridement Description: Excisional Instrument: Curette Bleeding: Minimum Hemostasis Achieved: Pressure End Time: 15:26 Procedural Pain: 0 Post Procedural Pain: 0 Response to Treatment: Procedure was tolerated  well Level of Consciousness Awake and Alert (Post-procedure): Post Debridement Measurements of Total Wound Length: (cm) 2.1 Width: (cm) 1.1 Depth: (cm) 0.3 Volume: (cm) 0.544 Character of Wound/Ulcer Post Debridement: Improved Severity of Tissue Post Debridement: Fat layer exposed Post Procedure Diagnosis Same as Pre-procedure Electronic Signature(s) Signed: 03/24/2018 5:21:58 PM By: Curtis Sites Signed: 03/27/2018 1:32:49 PM By: Lenda Kelp PA-C Entered By: Curtis Sites on 03/24/2018 15:25:12 [name] [name] ([id number]) -------------------------------------------------------------------------------- HPI Details Patient Name: [name] [name] Date of Service: 03/24/2018 2:30 PM Medical Record Number: [id number] Patient Account Number: [id number] Date of Birth/Sex: [birth year] (57 y.o. M) Treating RN: Curtis Sites Primary Care Provider: Dala Dock Other Clinician: Referring Provider: Dala Dock Treating Provider/Extender: Linwood Dibbles, HOYT Weeks in Treatment: 1 History of Present Illness HPI Description: 03/17/18 on evaluation today patient is sent to [place] by way of his primary care provider at [facility] for an evaluation regarding his thoughts left plantar foot ulcer. Patient has previously undergone x-ray of his left foot on 02/06/18 which showed evidence of intra-articular osseous erosions of the first distal phalanx base in the first proximal phalanx head which are in proximity to a plantar skin and soft tissue ulcer. 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 The patient also has a second ulcer in which is more on the plantar surface of his foot which is actually open at this point apparently the toe wound has been closed for number of years. Nonetheless for the past six months he's been dealing with the plantar foot wound and unfortunately it does not seem to be  improving significantly. He does have diabetes mellitus type II and this last hemoglobin A1c was 7.3 on 01/15/18. He does have a referral as well to see vascular at [facility]. Nonetheless he has not had this appointment as of yet. In fact he states they called him to schedule this when he was driving down the road they were supposed to call him back since he could  make the schedule at that point being that he was driving and he has not heard back from them yet. Nonetheless the patient states he doesn't have any pain secondary to neuropathy which is at least good news. No fevers, chills, nausea, or vomiting noted at this time. He states that in regard to the toe he really is not concerned about this with the wound since it has been closed for a couple of years at this point and has not really given him any trouble nonetheless his x-ray would reveal seemingly that he does have osteomyelitis at the site and my concern is it may be spreading to the plantar surface of his foot leading to the ulcerations and nonhealing wound that he has at that site. 03/24/18 on evaluation today patient actually appears to be doing excellent in regard to his plantar foot ulcer. There's actually little bit of improvement in regard to the overall size of the wound which is good news. He doesn't appear to have as much callous either. Nonetheless I am still somewhat concerned about the amount of pressure that is likely getting to the wound bed. Apparently he was previously given a peg assist offloading shoe although it was never properly set up for him know the pegs were popped out and it's actually placed in the shoe upside down. Nonetheless this is not really doing anything for him as far as what we needed to do. With that being said I do believe that a proper peg assist offloading shoe could be of benefit for him. Electronic Signature(s) Signed: 03/27/2018 1:32:49 PM By: Lenda Kelp PA-C Entered By: Lenda Kelp  on 03/27/2018 13:27:15 [name] [name] ([id number]) -------------------------------------------------------------------------------- Physical Exam Details Patient Name: [name] [name] Date of Service: 03/24/2018 2:30 PM Medical Record Number: [id number] Patient Account Number: [id number] Date of Birth/Sex: [birth year] (57 y.o. M) Treating RN: Curtis Sites Primary Care Provider: Dala Dock Other Clinician: Referring Provider: Dala Dock Treating Provider/Extender: STONE III, HOYT Weeks in Treatment: 1 Constitutional Well-nourished and well-hydrated in no acute distress. Respiratory normal breathing without difficulty. Psychiatric this patient is able to make decisions and demonstrates good insight into disease process. Alert and Oriented x 3. pleasant and cooperative. Notes Patient's wound bed did require sharp debridement today to remove necrotic material from the surface of the wound. He tolerated this without complication. With that being said I do feel like that he is showing signs of improvement although we are are in the very early stages of this to be honest. Electronic Signature(s) Signed: 03/27/2018 1:32:49 PM By: Lenda Kelp PA-C Entered By: Lenda Kelp on 03/27/2018 13:27:52 [name] [name] ([id number]) -------------------------------------------------------------------------------- Physician Orders Details Patient Name: [name] [name] Date of Service: 03/24/2018 2:30 PM Medical Record Number: [id number] Patient Account Number: [id number] Date of Birth/Sex: 1961-05-20 (57 y.o. M) Treating RN: Curtis Sites Primary Care Provider: Dala Dock Other Clinician: Referring Provider: Dala Dock Treating Provider/Extender: Linwood Dibbles, HOYT Weeks in Treatment: 1 Verbal / Phone Orders: No Diagnosis Coding ICD-10 Coding Code Description E11.621 Type 2 diabetes mellitus with foot ulcer L97.522 Non-pressure chronic ulcer of other part of left foot with fat layer  exposed E08.40 Diabetes mellitus due to underlying condition with diabetic neuropathy, unspecified I10 Essential (primary) hypertension Wound Cleansing Wound #1 Left,Plantar Foot o Cleanse wound with mild soap and water o May Shower, gently pat wound dry prior to applying new dressing. Wound #2 Left,Plantar Toe Great o Cleanse wound with mild soap and water o May Shower, gently pat wound dry  prior to applying new dressing. Anesthetic (add to Medication List) Wound #1 Left,Plantar Foot o Topical Lidocaine 4% cream applied to wound bed prior to debridement (In Clinic Only). Wound #2 Left,Plantar Toe Great o Topical Lidocaine 4% cream applied to wound bed prior to debridement (In Clinic Only). Primary Wound Dressing Wound #1 Left,Plantar Foot o Silver Alginate Wound #2 Left,Plantar Toe Great o Other: - open to air Secondary Dressing Wound #1 Left,Plantar Foot o Other - bandaid or coverlet or any cover bandage Dressing Change Frequency Wound #1 Left,Plantar Foot o Change dressing every day. Follow-up Appointments Wound #1 Left,Plantar Foot o Return Appointment in 1 week. [name] [name], [name] [name] ([id number]) Off-Loading Wound #1 Left,Plantar Foot o Other: - wear your offloading shoe Wound #2 Left,Plantar Toe Great o Other: - wear your offloading shoe Electronic Signature(s) Signed: 03/24/2018 5:21:58 PM By: Curtis Sites Signed: 03/27/2018 1:32:49 PM By: Lenda Kelp PA-C Entered By: Curtis Sites on 03/24/2018 15:28:01 [name] [name] ([id number]) -------------------------------------------------------------------------------- Problem List Details Patient Name: [name] [name] Date of Service: 03/24/2018 2:30 PM Medical Record Number: [id number] Patient Account Number: [id number] Date of Birth/Sex: 04/26/1961 (57 y.o. M) Treating RN: Curtis Sites Primary Care Provider: Dala Dock Other Clinician: Referring Provider: Dala Dock Treating  Provider/Extender: Linwood Dibbles, HOYT Weeks in Treatment: 1 Active Problems ICD-10 Evaluated Encounter Code Description Active Date Today Diagnosis E11.621 Type 2 diabetes mellitus with foot ulcer 03/17/2018 No Yes L97.522 Non-pressure chronic ulcer of other part of left foot with fat 03/17/2018 No Yes layer exposed E08.40 Diabetes mellitus due to underlying condition with diabetic 03/17/2018 No Yes neuropathy, unspecified I10 Essential (primary) hypertension 03/17/2018 No Yes Inactive Problems Resolved Problems Electronic Signature(s) Signed: 03/27/2018 1:32:49 PM By: Lenda Kelp PA-C Entered By: Lenda Kelp on 03/24/2018 15:15:07 [name] [name] ([id number]) -------------------------------------------------------------------------------- Progress Note Details Patient Name: [name] [name] Date of Service: 03/24/2018 2:30 PM Medical Record Number: [id number] Patient Account Number: [id number] Date of Birth/Sex: 06-23-1960 (57 y.o. M) Treating RN: Curtis Sites Primary Care Provider: Dala Dock Other Clinician: Referring Provider: Dala Dock Treating Provider/Extender: Linwood Dibbles, HOYT Weeks in Treatment: 1 Subjective Chief Complaint Information obtained from Patient Left foot ulcers History of Present Illness (HPI) 03/17/18 on evaluation today patient is sent to [place] by way of his primary care provider at [facility] for an evaluation regarding his thoughts left plantar foot ulcer. Patient has previously undergone x-ray of his left foot on 02/06/18 which showed evidence of intra-articular osseous erosions of the first distal phalanx base in the first proximal phalanx head which are in proximity to a plantar skin and soft tissue ulcer. 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 The patient also has a second ulcer in which is more on the plantar surface of his foot which is  actually open at this point apparently the toe wound has been closed for number of years. Nonetheless for the past six months he's been dealing with the plantar foot wound and unfortunately it does not seem to be improving significantly. He does have diabetes mellitus type II and this last hemoglobin A1c was 7.3 on 01/15/18. He does have a referral as well to see vascular at [facility]. Nonetheless he has not had this appointment as of yet. In fact he states they called him to schedule this when he was driving down the road they were supposed to call him back since he could make the schedule at that point being that  he was driving and he has not heard back from them yet. Nonetheless the patient states he doesn't have any pain secondary to neuropathy which is at least good news. No fevers, chills, nausea, or vomiting noted at this time. He states that in regard to the toe he really is not concerned about this with the wound since it has been closed for a couple of years at this point and has not really given him any trouble nonetheless his x-ray would reveal seemingly that he does have osteomyelitis at the site and my concern is it may be spreading to the plantar surface of his foot leading to the ulcerations and nonhealing wound that he has at that site. 03/24/18 on evaluation today patient actually appears to be doing excellent in regard to his plantar foot ulcer. There's actually little bit of improvement in regard to the overall size of the wound which is good news. He doesn't appear to have as much callous either. Nonetheless I am still somewhat concerned about the amount of pressure that is likely getting to the wound bed. Apparently he was previously given a peg assist offloading shoe although it was never properly set up for him know the pegs were popped out and it's actually placed in the shoe upside down. Nonetheless this is not really doing anything for him as far as what we needed to  do. With that being said I do believe that a proper peg assist offloading shoe could be of benefit for him. Patient History Information obtained from Patient. Family History Cancer - Father, Hypertension - Father, No family history of Diabetes, Heart Disease, Kidney Disease, Lung Disease, Seizures, Stroke, Thyroid Problems, Tuberculosis. Social History Never smoker, Marital Status - Married, Alcohol Use - Moderate, Drug Use - No History, Caffeine Use - Daily. Review of Systems (ROS) Constitutional Symptoms (General Health) Denies complaints or symptoms of Fever, Chills. Respiratory COVE, HAYDON ([id number]) The patient has no complaints or symptoms. Cardiovascular The patient has no complaints or symptoms. Psychiatric The patient has no complaints or symptoms. Objective Constitutional Well-nourished and well-hydrated in no acute distress. Vitals Time Taken: 2:32 PM, Height: 73 in, Weight: 267.7 lbs, BMI: 35.3, Temperature: 97.8 F, Pulse: 77 bpm, Respiratory Rate: 16 breaths/min, Blood Pressure: 151/78 mmHg. Respiratory normal breathing without difficulty. Psychiatric this patient is able to make decisions and demonstrates good insight into disease process. Alert and Oriented x 3. pleasant and cooperative. General Notes: Patient's wound bed did require sharp debridement today to remove necrotic material from the surface of the wound. He tolerated this without complication. With that being said I do feel like that he is showing signs of improvement although we are are in the very early stages of this to be honest. Integumentary (Hair, Skin) Wound #1 status is Open. Original cause of wound was Trauma. The wound is located on the Left,Plantar Foot. The wound measures 2cm length x 1cm width x 0.4cm depth; 1.571cm^2 area and 0.628cm^3 volume. There is Fat Layer (Subcutaneous Tissue) Exposed exposed. There is no tunneling noted, however, there is undermining starting at 12:00 and  ending at 12:00 with a maximum distance of 0.4cm. There is a medium amount of serous drainage noted. The wound margin is flat and intact. There is medium (34-66%) hyper - granulation within the wound bed. There is a medium (34-66%) amount of necrotic tissue within the wound bed including Adherent Slough. The periwound skin appearance exhibited: Callus. Periwound temperature was noted as No Abnormality. Wound #2 status is Open. Original  cause of wound was Gradually Appeared. The wound is located on the [employer]. The wound measures 0.6cm length x 0.8cm width x 0.1cm depth; 0.377cm^2 area and 0.038cm^3 volume. Assessment Active Problems ICD-10 Type 2 diabetes mellitus with foot ulcer Non-pressure chronic ulcer of other part of left foot with fat layer exposed Diabetes mellitus due to underlying condition with diabetic neuropathy, unspecified [name] [name], [name] [name] ([id number]) Essential (primary) hypertension Procedures Wound #1 Pre-procedure diagnosis of Wound #1 is a Diabetic Wound/Ulcer of the Lower Extremity located on the Left,Plantar Foot .Severity of Tissue Pre Debridement is: Fat layer exposed. There was a Excisional Skin/Subcutaneous Tissue Debridement with a total area of 2 sq cm performed by STONE III, HOYT E., PA-C. With the following instrument(s): Curette to remove Viable and Non-Viable tissue/material. Material removed includes Callus, Subcutaneous Tissue, and Slough after achieving pain control using Lidocaine 4% Topical Solution. No specimens were taken. A time out was conducted at 15:20, prior to the start of the procedure. A Minimum amount of bleeding was controlled with Pressure. The procedure was tolerated well with a pain level of 0 throughout and a pain level of 0 following the procedure. Post Debridement Measurements: 2.1cm length x 1.1cm width x 0.3cm depth; 0.544cm^3 volume. Character of Wound/Ulcer Post Debridement is improved. Severity of Tissue Post Debridement  is: Fat layer exposed. Post procedure Diagnosis Wound #1: Same as Pre-Procedure Plan Wound Cleansing: Wound #1 Left,Plantar Foot: Cleanse wound with mild soap and water May Shower, gently pat wound dry prior to applying new dressing. Wound #2 Left,Plantar Toe Great: Cleanse wound with mild soap and water May Shower, gently pat wound dry prior to applying new dressing. Anesthetic (add to Medication List): Wound #1 Left,Plantar Foot: Topical Lidocaine 4% cream applied to wound bed prior to debridement (In Clinic Only). Wound #2 Left,Plantar Toe Great: Topical Lidocaine 4% cream applied to wound bed prior to debridement (In Clinic Only). Primary Wound Dressing: Wound #1 Left,Plantar Foot: Silver Alginate Wound #2 Left,Plantar Toe Great: Other: - open to air Secondary Dressing: Wound #1 Left,Plantar Foot: Other - bandaid or coverlet or any cover bandage Dressing Change Frequency: Wound #1 Left,Plantar Foot: Change dressing every day. Follow-up Appointments: Wound #1 Left,Plantar Foot: Return Appointment in 1 week. Off-Loading: Wound #1 Left,Plantar Foot: Other: - wear your offloading shoe [name] [name], [name] [name] ([id number]) Wound #2 Left,Plantar Toe Great: Other: - wear your offloading shoe I'm gonna suggest currently that we continue with the above wound care measures for the next week. He is in agreement the plan. In fact what we will do is a two week check for him since he is self paid this will help save a little bit of money in that regard and again I feel like he is safe to do this based on what I'm seeing in regard to the wound at this point. In the meantime we are still working on checking on to resources for uninsured patients at [facility] at the wound care center there. Will let them know if we find out anything in that regard. Please see above for specific wound care orders. We will see patient for re-evaluation in 2 week(s) here in the clinic. If anything worsens or  changes patient will contact our office for additional recommendations. Electronic Signature(s) Signed: 03/27/2018 1:32:49 PM By: Lenda Kelp PA-C Entered By: Lenda Kelp on 03/27/2018 13:28:53 [name] [name] ([id number]) -------------------------------------------------------------------------------- ROS/PFSH Details Patient Name: [name] [name] Date of Service: 03/24/2018 2:30 PM Medical Record Number: [id number] Patient Account Number: [id number]  Date of Birth/Sex: 07-15-1960 (57 y.o. M) Treating RN: Curtis Sites Primary Care Provider: Dala Dock Other Clinician: Referring Provider: Dala Dock Treating Provider/Extender: STONE III, HOYT Weeks in Treatment: 1 Information Obtained From Patient Wound History Do you currently have one or more open woundso Yes How many open wounds do you currently haveo 1 Approximately how long have you had your woundso 6 months How have you been treating your wound(s) until nowo neosporin Has your wound(s) ever healed and then re-openedo No Have you had any lab work done in the past montho No Have you tested positive for an antibiotic resistant organism (MRSA, VRE)o No Have you tested positive for osteomyelitis (bone infection)o No Have you had any tests for circulation on your legso No Constitutional Symptoms (General Health) Complaints and Symptoms: Negative for: Fever; Chills Eyes Medical History: Negative for: Cataracts; Glaucoma Ear/Nose/Mouth/Throat Medical History: Negative for: Chronic sinus problems/congestion; Middle ear problems Hematologic/Lymphatic Medical History: Negative for: Anemia; Hemophilia; Lymphedema; Sickle Cell Disease Respiratory Complaints and Symptoms: No Complaints or Symptoms Medical History: Negative for: Aspiration; Asthma; Chronic Obstructive Pulmonary Disease (COPD); Pneumothorax; Sleep Apnea; Tuberculosis Cardiovascular Complaints and Symptoms: No Complaints or Symptoms Medical  History: Positive for: Hypertension Negative for: Angina; Arrhythmia; Congestive Heart Failure; Coronary Artery Disease; Deep Vein Thrombosis; Hypotension; ELIOR, [name] [name] ([id number]) Myocardial Infarction; Peripheral Arterial Disease; Peripheral Venous Disease; Phlebitis; Vasculitis Gastrointestinal Medical History: Negative for: Cirrhosis ; Colitis; Crohnos; Hepatitis A; Hepatitis B; Hepatitis C Endocrine Medical History: Positive for: Type II Diabetes Negative for: Type I Diabetes Time with diabetes: 3 years Treated with: Oral agents Blood sugar tested every day: Yes Tested : Blood sugar testing results: Dinner: 122 Genitourinary Medical History: Negative for: End Stage Renal Disease Immunological Medical History: Negative for: Lupus Erythematosus; Raynaudos; Scleroderma Integumentary (Skin) Medical History: Negative for: History of Burn; History of pressure wounds Musculoskeletal Medical History: Positive for: Osteoarthritis - hands, back Negative for: Gout; Rheumatoid Arthritis; Osteomyelitis Neurologic Medical History: Positive for: Neuropathy - Feet Negative for: Dementia; Quadriplegia; Paraplegia; Seizure Disorder Oncologic Medical History: Negative for: Received Chemotherapy; Received Radiation Psychiatric Complaints and Symptoms: No Complaints or Symptoms Medical History: Negative for: Anorexia/bulimia; Confinement Anxiety [name] [name], [name] [name] ([id number]) Immunizations Pneumococcal Vaccine: Received Pneumococcal Vaccination: No Tetanus Vaccine: Last tetanus shot: 02/18/2015 Implantable Devices Family and Social History Cancer: Yes - Father; Diabetes: No; Heart Disease: No; Hypertension: Yes - Father; Kidney Disease: No; Lung Disease: No; Seizures: No; Stroke: No; Thyroid Problems: No; Tuberculosis: No; Never smoker; Marital Status - Married; Alcohol Use: Moderate; Drug Use: No History; Caffeine Use: Daily; Advanced Directives: No; Patient does not want  information on Advanced Directives; Living Will: No; Medical Power of Attorney: No Physician Affirmation I have reviewed and agree with the above information. Electronic Signature(s) Signed: 03/27/2018 1:32:49 PM By: Lenda Kelp PA-C Signed: 03/27/2018 5:34:16 PM By: Curtis Sites Entered By: Lenda Kelp on 03/27/2018 13:27:31 [name] [name] ([id number]) -------------------------------------------------------------------------------- SuperBill Details Patient Name: [name] [name] Date of Service: 03/24/2018 Medical Record Number: [id number] Patient Account Number: [id number] Date of Birth/Sex: 1960-07-08 (57 y.o. M) Treating RN: Curtis Sites Primary Care Provider: Dala Dock Other Clinician: Referring Provider: Dala Dock Treating Provider/Extender: STONE III, HOYT Weeks in Treatment: 1 Diagnosis Coding ICD-10 Codes Code Description E11.621 Type 2 diabetes mellitus with foot ulcer L97.522 Non-pressure chronic ulcer of other part of left foot with fat layer exposed E08.40 Diabetes mellitus due to underlying condition with diabetic neuropathy, unspecified I10 Essential (primary) hypertension Facility Procedures CPT4 Code: 74259563 Description: 11042 - DEB SUBQ TISSUE 20  SQ CM/< ICD-10 Diagnosis Description L97.522 Non-pressure chronic ulcer of other part of left foot with fat Modifier: layer exposed Quantity: 1 Physician Procedures CPT4 Code: 1610960 Description: 11042 - WC PHYS SUBQ TISS 20 SQ CM ICD-10 Diagnosis Description L97.522 Non-pressure chronic ulcer of other part of left foot with fat Modifier: layer exposed Quantity: 1 Electronic Signature(s) Signed: 03/27/2018 1:32:49 PM By: Lenda Kelp PA-C Entered By: Lenda Kelp on 03/25/2018 07:32:51

## 2018-04-07 ENCOUNTER — Ambulatory Visit: Payer: Self-pay | Admitting: Physician Assistant

## 2019-07-06 ENCOUNTER — Emergency Department: Payer: Self-pay

## 2019-07-06 ENCOUNTER — Inpatient Hospital Stay
Admission: EM | Admit: 2019-07-06 | Discharge: 2019-07-10 | DRG: 617 | Disposition: A | Discharge status: Home / Self Care | Payer: Self-pay | Source: Ambulatory Visit | Attending: Family Medicine | Admitting: Family Medicine

## 2019-07-06 ENCOUNTER — Other Ambulatory Visit: Payer: Self-pay

## 2019-07-06 DIAGNOSIS — L02611 Cutaneous abscess of right foot: Secondary | ICD-10-CM | POA: Diagnosis present

## 2019-07-06 DIAGNOSIS — Z79899 Other long term (current) drug therapy: Secondary | ICD-10-CM

## 2019-07-06 DIAGNOSIS — Z8249 Family history of ischemic heart disease and other diseases of the circulatory system: Secondary | ICD-10-CM

## 2019-07-06 DIAGNOSIS — L97429 Non-pressure chronic ulcer of left heel and midfoot with unspecified severity: Secondary | ICD-10-CM | POA: Diagnosis present

## 2019-07-06 DIAGNOSIS — Z23 Encounter for immunization: Secondary | ICD-10-CM

## 2019-07-06 DIAGNOSIS — E11621 Type 2 diabetes mellitus with foot ulcer: Secondary | ICD-10-CM | POA: Diagnosis present

## 2019-07-06 DIAGNOSIS — E11628 Type 2 diabetes mellitus with other skin complications: Secondary | ICD-10-CM

## 2019-07-06 DIAGNOSIS — Z6839 Body mass index (BMI) 39.0-39.9, adult: Secondary | ICD-10-CM

## 2019-07-06 DIAGNOSIS — L97529 Non-pressure chronic ulcer of other part of left foot with unspecified severity: Secondary | ICD-10-CM | POA: Diagnosis present

## 2019-07-06 DIAGNOSIS — K0889 Other specified disorders of teeth and supporting structures: Secondary | ICD-10-CM | POA: Diagnosis present

## 2019-07-06 DIAGNOSIS — E1152 Type 2 diabetes mellitus with diabetic peripheral angiopathy with gangrene: Secondary | ICD-10-CM | POA: Diagnosis present

## 2019-07-06 DIAGNOSIS — Z7984 Long term (current) use of oral hypoglycemic drugs: Secondary | ICD-10-CM

## 2019-07-06 DIAGNOSIS — K08409 Partial loss of teeth, unspecified cause, unspecified class: Secondary | ICD-10-CM | POA: Diagnosis present

## 2019-07-06 DIAGNOSIS — M86171 Other acute osteomyelitis, right ankle and foot: Secondary | ICD-10-CM | POA: Diagnosis present

## 2019-07-06 DIAGNOSIS — Z7982 Long term (current) use of aspirin: Secondary | ICD-10-CM

## 2019-07-06 DIAGNOSIS — I10 Essential (primary) hypertension: Secondary | ICD-10-CM | POA: Diagnosis present

## 2019-07-06 DIAGNOSIS — Z09 Encounter for follow-up examination after completed treatment for conditions other than malignant neoplasm: Secondary | ICD-10-CM

## 2019-07-06 DIAGNOSIS — E1165 Type 2 diabetes mellitus with hyperglycemia: Secondary | ICD-10-CM | POA: Diagnosis present

## 2019-07-06 DIAGNOSIS — L97522 Non-pressure chronic ulcer of other part of left foot with fat layer exposed: Secondary | ICD-10-CM

## 2019-07-06 DIAGNOSIS — Z20822 Contact with and (suspected) exposure to covid-19: Secondary | ICD-10-CM | POA: Diagnosis present

## 2019-07-06 DIAGNOSIS — M659 Synovitis and tenosynovitis, unspecified: Secondary | ICD-10-CM | POA: Diagnosis present

## 2019-07-06 DIAGNOSIS — E1142 Type 2 diabetes mellitus with diabetic polyneuropathy: Secondary | ICD-10-CM | POA: Diagnosis present

## 2019-07-06 DIAGNOSIS — E1169 Type 2 diabetes mellitus with other specified complication: Principal | ICD-10-CM | POA: Diagnosis present

## 2019-07-06 DIAGNOSIS — L97514 Non-pressure chronic ulcer of other part of right foot with necrosis of bone: Secondary | ICD-10-CM

## 2019-07-06 DIAGNOSIS — E785 Hyperlipidemia, unspecified: Secondary | ICD-10-CM | POA: Diagnosis present

## 2019-07-06 DIAGNOSIS — IMO0002 Reserved for concepts with insufficient information to code with codable children: Secondary | ICD-10-CM

## 2019-07-06 DIAGNOSIS — L03031 Cellulitis of right toe: Secondary | ICD-10-CM

## 2019-07-06 DIAGNOSIS — M19071 Primary osteoarthritis, right ankle and foot: Secondary | ICD-10-CM | POA: Diagnosis present

## 2019-07-06 LAB — COMPREHENSIVE METABOLIC PANEL
ALT: 13 U/L (ref 0–44)
AST: 12 U/L — ABNORMAL LOW (ref 15–41)
Albumin: 3.8 g/dL (ref 3.5–5.0)
Alkaline Phosphatase: 55 U/L (ref 38–126)
Anion gap: 7 (ref 5–15)
BUN: 23 mg/dL — ABNORMAL HIGH (ref 6–20)
CO2: 29 mmol/L (ref 22–32)
Calcium: 9.2 mg/dL (ref 8.9–10.3)
Chloride: 98 mmol/L (ref 98–111)
Creatinine, Ser: 1.18 mg/dL (ref 0.61–1.24)
GFR calc Af Amer: >60 mL/min (ref >60)
GFR calc non Af Amer: >60 mL/min (ref >60)
Glucose, Bld: 159 mg/dL — ABNORMAL HIGH (ref 70–99)
Potassium: 4.6 mmol/L (ref 3.5–5.1)
Sodium: 134 mmol/L — ABNORMAL LOW (ref 135–145)
Total Bilirubin: 0.8 mg/dL (ref 0.3–1.2)
Total Protein: 7.4 g/dL (ref 6.5–8.1)

## 2019-07-06 LAB — CBC WITH DIFFERENTIAL/PLATELET
Abs Immature Granulocytes: 0.13 10*3/uL — ABNORMAL HIGH (ref 0.00–0.07)
Basophils Absolute: 0.1 10*3/uL (ref 0.0–0.1)
Basophils Relative: 0 %
Eosinophils Absolute: 0.5 10*3/uL (ref 0.0–0.5)
Eosinophils Relative: 4 %
HCT: 38.1 % — ABNORMAL LOW (ref 39.0–52.0)
Hemoglobin: 12.8 g/dL — ABNORMAL LOW (ref 13.0–17.0)
Immature Granulocytes: 1 %
Lymphocytes Relative: 21 %
Lymphs Abs: 3 10*3/uL (ref 0.7–4.0)
MCH: 30 pg (ref 26.0–34.0)
MCHC: 33.6 g/dL (ref 30.0–36.0)
MCV: 89.2 fL (ref 80.0–100.0)
Monocytes Absolute: 1.5 10*3/uL — ABNORMAL HIGH (ref 0.1–1.0)
Monocytes Relative: 10 %
Neutro Abs: 9.3 10*3/uL — ABNORMAL HIGH (ref 1.7–7.7)
Neutrophils Relative %: 64 %
Platelets: 330 10*3/uL (ref 150–400)
RBC: 4.27 MIL/uL (ref 4.22–5.81)
RDW: 12.7 % (ref 11.5–15.5)
WBC: 14.4 10*3/uL — ABNORMAL HIGH (ref 4.0–10.5)
nRBC: 0 % (ref 0.0–0.2)

## 2019-07-06 LAB — RESPIRATORY PANEL BY RT PCR (FLU A&B, COVID)
Influenza A by PCR: NEGATIVE
Influenza B by PCR: NEGATIVE
SARS Coronavirus 2 by RT PCR: NEGATIVE

## 2019-07-06 LAB — SEDIMENTATION RATE: Sed Rate: 67 mm/hr — ABNORMAL HIGH (ref 0–20)

## 2019-07-06 LAB — GLUCOSE, CAPILLARY: Glucose-Capillary: 160 mg/dL — ABNORMAL HIGH (ref 70–99)

## 2019-07-06 MED ORDER — PIPERACILLIN-TAZOBACTAM 3.375 G IVPB 30 MIN
3.3750 g | Freq: Once | INTRAVENOUS | Status: AC
  Administered 2019-07-06: 22:00:00 3.375 g via INTRAVENOUS
  Filled 2019-07-06: qty 50

## 2019-07-06 MED ORDER — VANCOMYCIN HCL IN DEXTROSE 1-5 GM/200ML-% IV SOLN
1000.0000 mg | Freq: Once | INTRAVENOUS | Status: AC
  Administered 2019-07-06: 21:00:00 1000 mg via INTRAVENOUS
  Filled 2019-07-06: qty 200

## 2019-07-06 MED ORDER — MUPIROCIN 2 % EX OINT
TOPICAL_OINTMENT | Freq: Once | CUTANEOUS | Status: AC
  Filled 2019-07-06: qty 22

## 2019-07-06 MED ORDER — SODIUM CHLORIDE 0.9 % IV SOLN
2.0000 g | Freq: Once | INTRAVENOUS | Status: AC
  Administered 2019-07-07: 2 g via INTRAVENOUS
  Filled 2019-07-06: qty 2

## 2019-07-06 NOTE — H&P (Signed)
History and Physical   Ozzie Knobel ASN:[id number] DOB: Dec 29, 1960 DOA: 07/06/2019  Referring MD/NP/PA: Dr. Daryel November  PCP: Shane Crutch, [place]   Outpatient Specialists: None  Patient coming from: Home  Chief Complaint: Right big toe infection  HPI: [name] [name] is a 59 y.o. male with medical history significant of diabetes, hypertension, hyperlipidemia, morbid obesity, previous cellulitis of the left foot who was sent over from [facility] for right toe infection that has not improved with outpatient therapy.  There is suspicion for osteomyelitis and cellulitis.  Patient was seen and evaluated in the ER.  He has had progressive worsening of the cellulitis for weeks.  It is now dark purulent swollen red and painful.  It started since diabetic ulcer at the side of the big toe and now engulfing the whole big toe and adjacent areas.  He has had some fever but no chills.  Patient also noticed some drainage from the area.  Initial x-ray in the ER did not suggest osteomyelitis although clinically it appears to be so.  Patient is being admitted for IV antibiotics.  ED Course: Temperature 97.9 blood pressure 165/87 pulse 95 respirate 18 oxygen sat 95% room air.  White count is 14.4 hemoglobin 12.8 and platelets 330.  Sodium 134 potassium 4.6 chloride 98 CO2 of 29 BUN 23 creatinine 1.18 and calcium 9.2.  Glucose 159.  COVID-19 screen is negative.  X-ray of the right great toe shows soft tissue swelling with no plain evidence of osteomyelitis.  Patient being admitted for IV antibiotics and management  Review of Systems: As per HPI otherwise 10 point review of systems negative.    Past Medical History:  Diagnosis Date  . Diabetes mellitus without complication (HCC)   . Hyperlipidemia   . Hypertension     Past Surgical History:  Procedure Laterality Date  . NO PAST SURGERIES       reports that he has never smoked. He has never used smokeless tobacco. He reports current alcohol use. He  reports that he does not use drugs.  No Known Allergies  Family History  Problem Relation Age of Onset  . Hypertension Father      Prior to Admission medications   Medication Sig Start Date End Date Taking? Authorizing Provider  aspirin 81 MG tablet Take 1 tablet (81 mg total) by mouth daily. 05/30/15  Yes Plonk, Chrissie Noa, MD  lisinopril (PRINIVIL,ZESTRIL) 10 MG tablet Take 1 tablet (10 mg total) by mouth daily. 09/25/15  Yes Plonk, Chrissie Noa, MD  metFORMIN (GLUCOPHAGE) 1000 MG tablet Take 1 tablet (1,000 mg total) by mouth 2 (two) times daily with a meal. 08/28/15  Yes Plonk, Chrissie Noa, MD  simvastatin (ZOCOR) 40 MG tablet Take 1 tablet (40 mg total) by mouth at bedtime. 08/25/15  Yes Plonk, Chrissie Noa, MD  b complex vitamins tablet Take 1 tablet by mouth daily.    [provider]    Physical Exam: Vitals:   07/06/19 1953  BP: (!) 165/87  Pulse: 95  Resp: 18  Temp: 97.9 F (36.6 C)  TempSrc: Oral  SpO2: 95%  Weight: 133.4 kg  Height: 6' (1.829 m)      Constitutional: Acutely ill looking no distress mainly anxious Vitals:   07/06/19 1953  BP: (!) 165/87  Pulse: 95  Resp: 18  Temp: 97.9 F (36.6 C)  TempSrc: Oral  SpO2: 95%  Weight: 133.4 kg  Height: 6' (1.829 m)   Eyes: PERRL, lids and conjunctivae normal ENMT: Mucous membranes are moist. Posterior pharynx  clear of any exudate or lesions.Normal dentition.  Neck: normal, supple, no masses, no thyromegaly Respiratory: clear to auscultation bilaterally, no wheezing, no crackles. Normal respiratory effort. No accessory muscle use.  Cardiovascular: Regular rate and rhythm, no murmurs / rubs / gallops. No extremity edema. 2+ pedal pulses. No carotid bruits.  Abdomen: no tenderness, no masses palpated. No hepatosplenomegaly. Bowel sounds positive.  Musculoskeletal: no clubbing / cyanosis. No joint deformity upper and lower extremities. Good ROM, no contractures. Normal muscle tone.  Skin: Right foot swollen, red around the  forefoot and the right big toe, dark black ulcerated area on the medial aspect of the right big toe with purulent discharge Neurologic: CN 2-12 grossly intact. Sensation intact, DTR normal. Strength 5/5 in all 4.  Psychiatric: Normal judgment and insight. Alert and oriented x 3. Normal mood.     Labs on Admission: I have personally reviewed following labs and imaging studies  CBC: Recent Labs  Lab 07/06/19 2022  WBC 14.4*  NEUTROABS 9.3*  HGB 12.8*  HCT 38.1*  MCV 89.2  PLT 330   Basic Metabolic Panel: Recent Labs  Lab 07/06/19 2022  NA 134*  K 4.6  CL 98  CO2 29  GLUCOSE 159*  BUN 23*  CREATININE 1.18  CALCIUM 9.2   GFR: Estimated Creatinine Clearance: 96.4 mL/min (by C-G formula based on SCr of 1.18 mg/dL). Liver Function Tests: Recent Labs  Lab 07/06/19 2022  AST 12*  ALT 13  ALKPHOS 55  BILITOT 0.8  PROT 7.4  ALBUMIN 3.8   No results for input(s): LIPASE, AMYLASE in the last 168 hours. No results for input(s): AMMONIA in the last 168 hours. Coagulation Profile: No results for input(s): INR, PROTIME in the last 168 hours. Cardiac Enzymes: No results for input(s): CKTOTAL, CKMB, CKMBINDEX, TROPONINI in the last 168 hours. BNP (last 3 results) No results for input(s): PROBNP in the last 8760 hours. HbA1C: No results for input(s): HGBA1C in the last 72 hours. CBG: Recent Labs  Lab 07/06/19 2240  GLUCAP 160*   Lipid Profile: No results for input(s): CHOL, HDL, LDLCALC, TRIG, CHOLHDL, LDLDIRECT in the last 72 hours. Thyroid Function Tests: No results for input(s): TSH, T4TOTAL, FREET4, T3FREE, THYROIDAB in the last 72 hours. Anemia Panel: No results for input(s): VITAMINB12, FOLATE, FERRITIN, TIBC, IRON, RETICCTPCT in the last 72 hours. Urine analysis: No results found for: COLORURINE, APPEARANCEUR, LABSPEC, PHURINE, GLUCOSEU, HGBUR, BILIRUBINUR, KETONESUR, PROTEINUR, UROBILINOGEN, NITRITE, LEUKOCYTESUR Sepsis  Labs: @LABRCNTIP (procalcitonin:4,lacticidven:4) ) Recent Results (from the past 240 hour(s))  Respiratory Panel by RT PCR (Flu A&B, Covid) - Nasopharyngeal Swab     Status: None   Collection Time: 07/06/19  9:43 PM   Specimen: Nasopharyngeal Swab  Result Value Ref Range Status   SARS Coronavirus 2 by RT PCR NEGATIVE NEGATIVE Final    Comment: (NOTE) SARS-CoV-2 target nucleic acids are NOT DETECTED. The SARS-CoV-2 RNA is generally detectable in upper respiratoy specimens during the acute phase of infection. The lowest concentration of SARS-CoV-2 viral copies this assay can detect is 131 copies/mL. A negative result does not preclude SARS-Cov-2 infection and should not be used as the sole basis for treatment or other patient management decisions. A negative result may occur with  improper specimen collection/handling, submission of specimen other than nasopharyngeal swab, presence of viral mutation(s) within the areas targeted by this assay, and inadequate number of viral copies (<131 copies/mL). A negative result must be combined with clinical observations, patient history, and epidemiological information. The expected result is  Negative. Fact Sheet for Patients:  PinkCheek.be Fact Sheet for Healthcare Providers:  GravelBags.it This test is not yet ap proved or cleared by the [place] FDA and  has been authorized for detection and/or diagnosis of SARS-CoV-2 by FDA under an Emergency Use Authorization (EUA). This EUA will remain  in effect (meaning this test can be used) for the duration of the COVID-19 declaration under Section 564(b)(1) of the Act, 21 U.S.C. section 360bbb-3(b)(1), unless the authorization is terminated or revoked sooner.    Influenza A by PCR NEGATIVE NEGATIVE Final   Influenza B by PCR NEGATIVE NEGATIVE Final    Comment: (NOTE) The Xpert Xpress SARS-CoV-2/FLU/RSV assay is intended as an aid in  the  diagnosis of influenza from Nasopharyngeal swab specimens and  should not be used as a sole basis for treatment. Nasal washings and  aspirates are unacceptable for Xpert Xpress SARS-CoV-2/FLU/RSV  testing. Fact Sheet for Patients: PinkCheek.be Fact Sheet for Healthcare Providers: GravelBags.it This test is not yet approved or cleared by the [place] FDA and  has been authorized for detection and/or diagnosis of SARS-CoV-2 by  FDA under an Emergency Use Authorization (EUA). This EUA will remain  in effect (meaning this test can be used) for the duration of the  Covid-19 declaration under Section 564(b)(1) of the Act, 21  U.S.C. section 360bbb-3(b)(1), unless the authorization is  terminated or revoked. Performed at [facility], [address] [place], [place] [address]      Radiological Exams on Admission: DG Toe Great Right  Result Date: 07/06/2019 CLINICAL DATA:  59 year old diabetic male with right great toe infection. EXAM: RIGHT GREAT TOE COMPARISON:  None. FINDINGS: Abnormal soft tissue swelling about the right great toe with stranding but no definite soft tissue gas. The great toe phalanges appear intact with no cortical osteolysis identified. Degenerative changes at the 1st MTP and IP joints. No acute osseous abnormality identified. IMPRESSION: Soft tissue swelling with no plain radiographic evidence of osteomyelitis. Electronically Signed   By: Genevie Ann M.D.   On: 07/06/2019 20:41     Assessment/Plan Principal Problem:   Cellulitis of great toe of right foot Active Problems:   Hypertension   Hyperlipidemia   Uncontrolled type 2 diabetes mellitus with foot ulcer, without long-term current use of insulin ([place])     #1 right great toe cellulitis and infected wound: Suspected osteomyelitis.  Initiate IV vancomycin and cefepime.  Get blood cultures.  Wound cultures will be obtained.  Patient will need  podiatry consult.  I will get MRI of the foot for a more sensitive study to rule out osteomyelitis.  Risk factors include his being a diabetic so we will have him more broad-spectrum coverage for polymicrobial organisms.  #2 diabetes: Sliding scale insulin in addition to home regimen.  #3 hypertension: Confirm and resume home regimen  #4 hyperlipidemia: Confirm and continue home statin  #5 leukocytosis: Secondary to foot infection.  Monitor white count   DVT prophylaxis: Lovenox Code Status: Full code Family Communication: No family at bedside Disposition Plan: Home Consults called: None but consult podiatry tomorrow Admission status: Inpatient  Severity of Illness: The appropriate patient status for this patient is INPATIENT. Inpatient status is judged to be reasonable and necessary in order to provide the required intensity of service to ensure the patient's safety. The patient's presenting symptoms, physical exam findings, and initial radiographic and laboratory data in the context of their chronic comorbidities is felt to place them at high risk for further  clinical deterioration. Furthermore, it is not anticipated that the patient will be medically stable for discharge from the hospital within 2 midnights of admission. The following factors support the patient status of inpatient.   " The patient's presenting symptoms include swelling tenderness and infection of the right foot. " The worrisome physical exam findings include dark swollen right foot with eschar and also medial side of the big toe. " The initial radiographic and laboratory data are worrisome because of x-ray showed no evidence of osteomyelitis. " The chronic co-morbidities include diabetes.   * I certify that at the point of admission it is my clinical judgment that the patient will require inpatient hospital care spanning beyond 2 midnights from the point of admission due to high intensity of service, high risk for  further deterioration and high frequency of surveillance required.Lonia Blood MD [facility] Pager [phone]  If 7PM-7AM, please contact night-coverage [email or web address] Password [facility]  07/06/2019, 11:45 PM

## 2019-07-06 NOTE — ED Triage Notes (Signed)
FIRST NURSE NOTE: Pt referred here from [facility] for R toe infection concerned for cellulitis or osteomyelitis.  Pt has hx of diabetes.  Pt ambulatory in lobby.

## 2019-07-06 NOTE — ED Provider Notes (Signed)
[facility] Emergency Department Provider Note       Time seen: ----------------------------------------- 8:22 PM on 07/06/2019 -----------------------------------------   I have reviewed the triage vital signs and the nursing notes.  HISTORY   Chief Complaint right toe infection    HPI [name] [name] is a 59 y.o. male with a history of diabetes, hyperlipidemia, hypertension who presents to the ED for right great toe infection.  Patient noticed a blister on his toe Sunday and then it burst on Monday.  He went to [facility] today and was sent here for evaluation concerning cellulitis or osteomyelitis.  He is diabetic, states it looks better today than it did yesterday.  He has no real pain in the right great toe today.  Past Medical History:  Diagnosis Date  . Diabetes mellitus without complication ([place])   . Hyperlipidemia   . Hypertension     Patient Active Problem List   Diagnosis Date Noted  . Obesity, Class I, BMI 30-34.9 08/25/2015  . Hyperlipidemia 05/30/2015  . Uncontrolled type 2 diabetes mellitus with foot ulcer, without long-term current use of insulin ([place]) 05/30/2015  . Hypertension 05/26/2015  . Erectile dysfunction 05/26/2015    Past Surgical History:  Procedure Laterality Date  . NO PAST SURGERIES      Allergies Patient has no known allergies.  Social History Social History   Tobacco Use  . Smoking status: Never Smoker  . Smokeless tobacco: Never Used  Substance Use Topics  . Alcohol use: Yes    Alcohol/week: 0.0 standard drinks    Comment: occasionally  . Drug use: No    Review of Systems Constitutional: Negative for fever. Cardiovascular: Negative for chest pain. Respiratory: Negative for shortness of breath. Musculoskeletal: Positive for right great toe wound Skin: Positive for right great toe wound Neurological: Negative for headaches, focal weakness or numbness.  All systems negative/normal/unremarkable  except as stated in the HPI  ____________________________________________   PHYSICAL EXAM:  VITAL SIGNS: ED Triage Vitals [07/06/19 1953]  Enc Vitals Group     BP (!) 165/87     Pulse Rate 95     Resp 18     Temp 97.9 F (36.6 C)     Temp Source Oral     SpO2 95 %     Weight 294 lb (133.4 kg)     Height 6' (1.829 m)     Head Circumference      Peak Flow      Pain Score 1     Pain Loc      Pain Edu?      Excl. in [place]?    Constitutional: Alert and oriented. Well appearing and in no distress. Eyes: Conjunctivae are normal. Normal extraocular movements. Cardiovascular: Normal rate, regular rhythm. No murmurs, rubs, or gallops. Respiratory: Normal respiratory effort without tachypnea nor retractions. Breath sounds are clear and equal bilaterally. No wheezes/rales/rhonchi. Musculoskeletal: Nontender with normal range of motion in extremities. No lower extremity tenderness nor edema. Neurologic:  Normal speech and language. No gross focal neurologic deficits are appreciated.  Skin: Patient has superficial skin avulsion over the lateral aspect of the right great toe, toe is grossly enlarged with proximal erythema. Psychiatric: Mood and affect are normal. Speech and behavior are normal.  ____________________________________________  ED COURSE:  As part of my medical decision making, I reviewed the following data within the [place] History obtained from family if available, nursing notes, old chart and ekg, as well as notes from  prior ED visits. Patient presented for possible right great toe infection, we will assess with labs and imaging as indicated at this time.   Procedures  [name] [name] was evaluated in Emergency Department on 07/06/2019 for the symptoms described in the history of present illness. He was evaluated in the context of the global COVID-19 pandemic, which necessitated consideration that the patient might be at risk for infection with the  SARS-CoV-2 virus that causes COVID-19. Institutional protocols and algorithms that pertain to the evaluation of patients at risk for COVID-19 are in a state of rapid change based on information released by regulatory bodies including the CDC and federal and state organizations. These policies and algorithms were followed during the patient's care in the ED.  ____________________________________________   LABS (pertinent positives/negatives)  Labs Reviewed  COMPREHENSIVE METABOLIC PANEL - Abnormal; Notable for the following components:      Result Value   Sodium 134 (*)    Glucose, Bld 159 (*)    BUN 23 (*)    AST 12 (*)    All other components within normal limits  SEDIMENTATION RATE - Abnormal; Notable for the following components:   Sed Rate 67 (*)    All other components within normal limits  CBC WITH DIFFERENTIAL/PLATELET    RADIOLOGY Images were viewed by me  Right great toe x-ray  IMPRESSION:  Soft tissue swelling with no plain radiographic evidence of  osteomyelitis.  ____________________________________________   DIFFERENTIAL DIAGNOSIS   Cellulitis, osteomyelitis, diabetic foot ulceration, hyperglycemia, neuropathy  FINAL ASSESSMENT AND PLAN  Right great toe infection   Plan: The patient had presented for right great toe wound with redness and swelling.  Patient was given IV vancomycin.  Patient's labs were concerning for osteomyelitis with an elevated sed rate. Patient's imaging revealed soft tissue swelling with no obvious signs of osteomyelitis, however I believe he will need an MRI. I have ordered broad-spectrum antibiotics for him. I will discuss with the hospitalist for admission.   Ulice Dash, MD    Note: This note was generated in part or whole with voice recognition software. Voice recognition is usually quite accurate but there are transcription errors that can and very often do occur. I apologize for any typographical errors that were not  detected and corrected.     Emily Filbert, MD 07/06/19 2134

## 2019-07-06 NOTE — ED Triage Notes (Signed)
Pt to the er for infection in the right great toe. Pt states he noted a blister on Sunday and then the blister burst on Monday. Pt went to [facility] today. Pt is diabetic.

## 2019-07-07 ENCOUNTER — Inpatient Hospital Stay: Payer: Self-pay

## 2019-07-07 DIAGNOSIS — I10 Essential (primary) hypertension: Secondary | ICD-10-CM

## 2019-07-07 DIAGNOSIS — I96 Gangrene, not elsewhere classified: Secondary | ICD-10-CM

## 2019-07-07 DIAGNOSIS — L84 Corns and callosities: Secondary | ICD-10-CM

## 2019-07-07 DIAGNOSIS — L97518 Non-pressure chronic ulcer of other part of right foot with other specified severity: Secondary | ICD-10-CM

## 2019-07-07 DIAGNOSIS — M869 Osteomyelitis, unspecified: Secondary | ICD-10-CM

## 2019-07-07 DIAGNOSIS — E11621 Type 2 diabetes mellitus with foot ulcer: Secondary | ICD-10-CM

## 2019-07-07 DIAGNOSIS — L97509 Non-pressure chronic ulcer of other part of unspecified foot with unspecified severity: Secondary | ICD-10-CM

## 2019-07-07 DIAGNOSIS — E1169 Type 2 diabetes mellitus with other specified complication: Principal | ICD-10-CM

## 2019-07-07 DIAGNOSIS — E1165 Type 2 diabetes mellitus with hyperglycemia: Secondary | ICD-10-CM

## 2019-07-07 DIAGNOSIS — Z7984 Long term (current) use of oral hypoglycemic drugs: Secondary | ICD-10-CM

## 2019-07-07 DIAGNOSIS — E1152 Type 2 diabetes mellitus with diabetic peripheral angiopathy with gangrene: Secondary | ICD-10-CM

## 2019-07-07 LAB — GLUCOSE, CAPILLARY
Glucose-Capillary: 216 mg/dL — ABNORMAL HIGH (ref 70–99)
Glucose-Capillary: 185 mg/dL — ABNORMAL HIGH (ref 70–99)
Glucose-Capillary: 178 mg/dL — ABNORMAL HIGH (ref 70–99)

## 2019-07-07 LAB — CBC
HCT: 38.6 % — ABNORMAL LOW (ref 39.0–52.0)
Hemoglobin: 12.9 g/dL — ABNORMAL LOW (ref 13.0–17.0)
MCH: 29.9 pg (ref 26.0–34.0)
MCHC: 33.4 g/dL (ref 30.0–36.0)
MCV: 89.6 fL (ref 80.0–100.0)
Platelets: 351 10*3/uL (ref 150–400)
RBC: 4.31 MIL/uL (ref 4.22–5.81)
RDW: 12.6 % (ref 11.5–15.5)
WBC: 11.6 10*3/uL — ABNORMAL HIGH (ref 4.0–10.5)
nRBC: 0 % (ref 0.0–0.2)

## 2019-07-07 LAB — COMPREHENSIVE METABOLIC PANEL
ALT: 12 U/L (ref 0–44)
AST: 14 U/L — ABNORMAL LOW (ref 15–41)
Albumin: 3.3 g/dL — ABNORMAL LOW (ref 3.5–5.0)
Alkaline Phosphatase: 49 U/L (ref 38–126)
Anion gap: 8 (ref 5–15)
BUN: 20 mg/dL (ref 6–20)
CO2: 27 mmol/L (ref 22–32)
Calcium: 9.2 mg/dL (ref 8.9–10.3)
Chloride: 97 mmol/L — ABNORMAL LOW (ref 98–111)
Creatinine, Ser: 1.17 mg/dL (ref 0.61–1.24)
GFR calc Af Amer: >60 mL/min (ref >60)
GFR calc non Af Amer: >60 mL/min (ref >60)
Glucose, Bld: 252 mg/dL — ABNORMAL HIGH (ref 70–99)
Potassium: 4.5 mmol/L (ref 3.5–5.1)
Sodium: 132 mmol/L — ABNORMAL LOW (ref 135–145)
Total Bilirubin: 0.6 mg/dL (ref 0.3–1.2)
Total Protein: 7 g/dL (ref 6.5–8.1)

## 2019-07-07 LAB — HIV ANTIBODY (ROUTINE TESTING W REFLEX): HIV Screen 4th Generation wRfx: NONREACTIVE

## 2019-07-07 MED ORDER — GADOBUTROL 1 MMOL/ML IV SOLN
10.0000 mL | Freq: Once | INTRAVENOUS | Status: AC | PRN
  Administered 2019-07-07: 04:00:00 10 mL via INTRAVENOUS
  Filled 2019-07-07: qty 10

## 2019-07-07 MED ORDER — ONDANSETRON HCL 4 MG PO TABS: 4.0000 mg | ORAL_TABLET | Freq: Four times a day (QID) | ORAL | Status: DC | PRN

## 2019-07-07 MED ORDER — SODIUM CHLORIDE 0.9 % IV SOLN: INTRAVENOUS | Status: DC

## 2019-07-07 MED ORDER — INSULIN ASPART 100 UNIT/ML ~~LOC~~ SOLN
0.0000 [IU] | Freq: Every day | SUBCUTANEOUS | Status: DC
  Administered 2019-07-08: 22:00:00 2 [IU] via SUBCUTANEOUS
  Filled 2019-07-07: qty 1

## 2019-07-07 MED ORDER — INSULIN ASPART 100 UNIT/ML ~~LOC~~ SOLN
0.0000 [IU] | Freq: Three times a day (TID) | SUBCUTANEOUS | Status: DC
  Administered 2019-07-07: 18:00:00 3 [IU] via SUBCUTANEOUS
  Administered 2019-07-07: 11:00:00 5 [IU] via SUBCUTANEOUS
  Administered 2019-07-08 (×2): 3 [IU] via SUBCUTANEOUS
  Administered 2019-07-08: 8 [IU] via SUBCUTANEOUS
  Administered 2019-07-09 (×2): 5 [IU] via SUBCUTANEOUS
  Administered 2019-07-09 – 2019-07-10 (×3): 3 [IU] via SUBCUTANEOUS
  Filled 2019-07-07 (×10): qty 1

## 2019-07-07 MED ORDER — INFLUENZA VAC SPLIT QUAD 0.5 ML IM SUSY
0.5000 mL | PREFILLED_SYRINGE | INTRAMUSCULAR | Status: AC
  Administered 2019-07-08: 0.5 mL via INTRAMUSCULAR
  Filled 2019-07-07: qty 0.5

## 2019-07-07 MED ORDER — ONDANSETRON HCL 4 MG/2ML IJ SOLN: 4.0000 mg | Freq: Four times a day (QID) | INTRAMUSCULAR | Status: DC | PRN

## 2019-07-07 MED ORDER — SIMVASTATIN 40 MG PO TABS
40.0000 mg | ORAL_TABLET | Freq: Every day | ORAL | Status: DC
  Administered 2019-07-07 – 2019-07-09 (×3): 40 mg via ORAL
  Filled 2019-07-07 (×4): qty 1

## 2019-07-07 MED ORDER — VANCOMYCIN HCL IN DEXTROSE 1-5 GM/200ML-% IV SOLN
1000.0000 mg | Freq: Once | INTRAVENOUS | Status: AC
  Administered 2019-07-07: 05:00:00 1000 mg via INTRAVENOUS
  Filled 2019-07-07: qty 200

## 2019-07-07 MED ORDER — SODIUM CHLORIDE 0.9 % IV SOLN
2.0000 g | Freq: Two times a day (BID) | INTRAVENOUS | Status: DC
  Filled 2019-07-07 (×2): qty 2

## 2019-07-07 MED ORDER — ACETAMINOPHEN 650 MG RE SUPP: 650.0000 mg | Freq: Four times a day (QID) | RECTAL | Status: DC | PRN

## 2019-07-07 MED ORDER — SODIUM CHLORIDE 0.9 % IV SOLN
2.0000 g | Freq: Three times a day (TID) | INTRAVENOUS | Status: DC
  Administered 2019-07-08 – 2019-07-09 (×6): 2 g via INTRAVENOUS
  Filled 2019-07-07 (×7): qty 2

## 2019-07-07 MED ORDER — VANCOMYCIN HCL 1500 MG/300ML IV SOLN
1500.0000 mg | Freq: Two times a day (BID) | INTRAVENOUS | Status: DC
  Administered 2019-07-07 – 2019-07-08 (×2): 1500 mg via INTRAVENOUS
  Filled 2019-07-07 (×3): qty 300

## 2019-07-07 MED ORDER — METRONIDAZOLE IN NACL 5-0.79 MG/ML-% IV SOLN
500.0000 mg | Freq: Three times a day (TID) | INTRAVENOUS | Status: DC
  Administered 2019-07-07 – 2019-07-09 (×6): 500 mg via INTRAVENOUS
  Filled 2019-07-07 (×8): qty 100

## 2019-07-07 MED ORDER — ASPIRIN EC 81 MG PO TBEC
81.0000 mg | DELAYED_RELEASE_TABLET | Freq: Every day | ORAL | Status: DC
  Administered 2019-07-07 – 2019-07-10 (×4): 81 mg via ORAL
  Filled 2019-07-07 (×4): qty 1

## 2019-07-07 MED ORDER — LISINOPRIL 10 MG PO TABS
10.0000 mg | ORAL_TABLET | Freq: Every day | ORAL | Status: DC
  Administered 2019-07-07 – 2019-07-10 (×4): 10 mg via ORAL
  Filled 2019-07-07 (×4): qty 1

## 2019-07-07 MED ORDER — ENOXAPARIN SODIUM 40 MG/0.4ML ~~LOC~~ SOLN
40.0000 mg | SUBCUTANEOUS | Status: DC
  Administered 2019-07-07 – 2019-07-09 (×3): 40 mg via SUBCUTANEOUS
  Filled 2019-07-07 (×4): qty 0.4

## 2019-07-07 MED ORDER — ACETAMINOPHEN 325 MG PO TABS
650.0000 mg | ORAL_TABLET | Freq: Four times a day (QID) | ORAL | Status: DC | PRN
  Administered 2019-07-09 (×3): 650 mg via ORAL
  Filled 2019-07-07 (×3): qty 2

## 2019-07-07 NOTE — ED Notes (Signed)
Pt resting comfortably at this time, call bell within reach, stretcher locked in lowest position. Denies any needs at this time.

## 2019-07-07 NOTE — Progress Notes (Signed)
PROGRESS NOTE    [name] [name]  WLS:[id number] DOB: January 22, 1961 DOA: 07/06/2019  PCP: Ranae Plumber, PA    LOS - 1   Brief Narrative:  [name] [name] is a 59 year old male with history of diabetes, hypertension, hyperlipidemia, previous cellulitis of left foot sent to the ED on 2/16 from [place] [facility] for right toe infection that had not improved with outpatient therapy,, suspicious for osteomyelitis and cellulitis.  In the ED, initial x-ray did not show osteomyelitis, however MRI showed osteomyelitis of the great toe distal phalanx and tenosynovitis of toes 1 through 3.  Labs were notable for leukocytosis 14.4.  Afebrile with stable vitals aside from hypertension.  Patient started on broad-spectrum IV antibiotics and admitted to hospitalist service for further evaluation and management.  Infectious disease and podiatry consulted.  Subjective 2/17: Patient seen and examined the ED on hold for bed.  He reports that he feels fine.  No fevers no chills no pain.  We discussed the results of his MRI.  He denies any other acute complaints including nausea vomiting diarrhea chest pain or shortness of breath.  No acute events reported since admission.  Assessment & Plan:   Principal Problem:   Cellulitis of great toe of right foot Active Problems:   Hypertension   Hyperlipidemia   Uncontrolled type 2 diabetes mellitus with foot ulcer, without long-term current use of insulin (HCC)   Osteomyelitis of the right great toe Cellulitis of the right great toe Secondary to nonhealing diabetic foot wound.  Failed outpatient antibiotic therapy.  MRI showing osteomyelitis of the left great toe distal phalanx as well as tenosynovitis. --Podiatry and infectious disease are consulted --Continue vancomycin and cefepime for now  Type 2 diabetes -last A1c in [facility] is from April 2017 was 8.1% -Sliding scale NovoLog -hold Metformin -A1c pending  Essential hypertension -continue home lisinopril  Hyperlipidemia  -continue home statin  DVT prophylaxis: Lovenox   Code Status: Not on file  Family Communication: None at bedside during encounter  Disposition Plan: Pending further improvement, cultures and cleared by consultants Coming From home Exp DC Date to be determined Barriers as above Medically Stable for Discharge?  No  Consultants:   Podiatry  Infectious disease  Procedures:   None  Antimicrobials:   Vancomycin and cefepime, 2/16 p.m. >> TBD   Objective: Vitals:   07/06/19 1953  BP: (!) 165/87  Pulse: 95  Resp: 18  Temp: 97.9 F (36.6 C)  TempSrc: Oral  SpO2: 95%  Weight: 133.4 kg  Height: 6' (1.829 m)   No intake or output data in the 24 hours ending 07/07/19 0821 Filed Weights   07/06/19 1953  Weight: 133.4 kg    Examination:  General exam: awake, alert, no acute distress HEENT: atraumatic, clear conjunctiva, anicteric sclera, moist mucus membranes, hearing grossly normal  Respiratory system: CTAB, no wheezes, rales or rhonchi, normal respiratory effort. Cardiovascular system: normal S1/S2, RRR, no JVD, murmurs, rubs, gallops, no pedal edema.   Gastrointestinal system: soft, NT, ND, no HSM felt, +bowel sounds. Central nervous system: A&O x4. no gross focal neurologic deficits, normal speech Extremities: moves all, normal tone, right foot dressing clean dry intact Skin: dry, intact, normal temperature, normal color Psychiatry: normal mood, congruent affect, judgement and insight appear normal    Data Reviewed: I have personally reviewed following labs and imaging studies  CBC: Recent Labs  Lab 07/06/19 2022  WBC 14.4*  NEUTROABS 9.3*  HGB 12.8*  HCT 38.1*  MCV 89.2  PLT 330  Basic Metabolic Panel: Recent Labs  Lab 07/06/19 2022  NA 134*  K 4.6  CL 98  CO2 29  GLUCOSE 159*  BUN 23*  CREATININE 1.18  CALCIUM 9.2   GFR: Estimated Creatinine Clearance: 96.4 mL/min (by C-G formula based on SCr of 1.18 mg/dL). Liver Function  Tests: Recent Labs  Lab 07/06/19 2022  AST 12*  ALT 13  ALKPHOS 55  BILITOT 0.8  PROT 7.4  ALBUMIN 3.8   No results for input(s): LIPASE, AMYLASE in the last 168 hours. No results for input(s): AMMONIA in the last 168 hours. Coagulation Profile: No results for input(s): INR, PROTIME in the last 168 hours. Cardiac Enzymes: No results for input(s): CKTOTAL, CKMB, CKMBINDEX, TROPONINI in the last 168 hours. BNP (last 3 results) No results for input(s): PROBNP in the last 8760 hours. HbA1C: No results for input(s): HGBA1C in the last 72 hours. CBG: Recent Labs  Lab 07/06/19 2240  GLUCAP 160*   Lipid Profile: No results for input(s): CHOL, HDL, LDLCALC, TRIG, CHOLHDL, LDLDIRECT in the last 72 hours. Thyroid Function Tests: No results for input(s): TSH, T4TOTAL, FREET4, T3FREE, THYROIDAB in the last 72 hours. Anemia Panel: No results for input(s): VITAMINB12, FOLATE, FERRITIN, TIBC, IRON, RETICCTPCT in the last 72 hours. Sepsis Labs: No results for input(s): PROCALCITON, LATICACIDVEN in the last 168 hours.  Recent Results (from the past 240 hour(s))  Respiratory Panel by RT PCR (Flu A&B, Covid) - Nasopharyngeal Swab     Status: None   Collection Time: 07/06/19  9:43 PM   Specimen: Nasopharyngeal Swab  Result Value Ref Range Status   SARS Coronavirus 2 by RT PCR NEGATIVE NEGATIVE Final    Comment: (NOTE) SARS-CoV-2 target nucleic acids are NOT DETECTED. The SARS-CoV-2 RNA is generally detectable in upper respiratoy specimens during the acute phase of infection. The lowest concentration of SARS-CoV-2 viral copies this assay can detect is 131 copies/mL. A negative result does not preclude SARS-Cov-2 infection and should not be used as the sole basis for treatment or other patient management decisions. A negative result may occur with  improper specimen collection/handling, submission of specimen other than nasopharyngeal swab, presence of viral mutation(s) within the areas  targeted by this assay, and inadequate number of viral copies (<131 copies/mL). A negative result must be combined with clinical observations, patient history, and epidemiological information. The expected result is Negative. Fact Sheet for Patients:  https://www.moore.com/ Fact Sheet for Healthcare Providers:  https://www.young.biz/ This test is not yet ap proved or cleared by the [place] FDA and  has been authorized for detection and/or diagnosis of SARS-CoV-2 by FDA under an Emergency Use Authorization (EUA). This EUA will remain  in effect (meaning this test can be used) for the duration of the COVID-19 declaration under Section 564(b)(1) of the Act, 21 U.S.C. section 360bbb-3(b)(1), unless the authorization is terminated or revoked sooner.    Influenza A by PCR NEGATIVE NEGATIVE Final   Influenza B by PCR NEGATIVE NEGATIVE Final    Comment: (NOTE) The Xpert Xpress SARS-CoV-2/FLU/RSV assay is intended as an aid in  the diagnosis of influenza from Nasopharyngeal swab specimens and  should not be used as a sole basis for treatment. Nasal washings and  aspirates are unacceptable for Xpert Xpress SARS-CoV-2/FLU/RSV  testing. Fact Sheet for Patients: https://www.moore.com/ Fact Sheet for Healthcare Providers: https://www.young.biz/ This test is not yet approved or cleared by the [place] FDA and  has been authorized for detection and/or diagnosis of SARS-CoV-2 by  FDA under  an Emergency Use Authorization (EUA). This EUA will remain  in effect (meaning this test can be used) for the duration of the  Covid-19 declaration under Section 564(b)(1) of the Act, 21  U.S.C. section 360bbb-3(b)(1), unless the authorization is  terminated or revoked. Performed at [facility], [address], [place], [place] [address]          Radiology Studies: MR FOOT RIGHT W WO CONTRAST  Result  Date: 07/07/2019 CLINICAL DATA:  Diabetic ulcer on the medial aspect of the right great toe. Swelling. EXAM: MRI OF THE RIGHT FOREFOOT WITHOUT AND WITH CONTRAST TECHNIQUE: Multiplanar, multisequence MR imaging of the right forefoot was performed before and after the administration of intravenous contrast. CONTRAST:  21mL GADAVIST GADOBUTROL 1 MMOL/ML IV SOLN COMPARISON:  Radiographs dated 07/06/2019 FINDINGS: Bones/Joint/Cartilage There is abnormal edema in the distal phalanx of the great toe with abnormal low signal on T1 imaging and diffuse enhancement of the distal phalanx of the great toe on postcontrast imaging consistent with osteomyelitis. There is severe arthritis of the first MTP joint with less severe arthritis of the second MTP joint. There are subcortical cysts and erosions of the heads of the first and second metatarsals. Does the patient have a history of gout? Minimal effusions at the first and second MTP joints. Muscles and Tendons Small amount of fluid and enhancement in the flexor hallucis longus tendon sheath and in the flexor tendon a sheath of the second and third toes at the level of the metatarsals. Slight edema and enhancement of the flexor digitorum muscles in the arch of the foot, nonspecific. Soft tissues Soft tissue ulcerations on the plantar aspect of the great toe as well as an ulceration on the medial dorsal aspect of the distal great toe. Both of these extend to the distal phalangeal bone. Edema on the dorsum of the foot, nonspecific. IMPRESSION: 1. Osteomyelitis of the distal phalanx of the great toe with 2 overlying soft tissue ulcers that extend to the bone. 2. 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 3. Tenosynovitis of the flexor tendons of the first, second and third toes. 4. Nonspecific edema and enhancement of the flexor digitorum muscles in the arch of the foot. Electronically Signed   By: Francene Boyers M.D.   On: 07/07/2019 08:08   DG Toe Great Right  Result Date:  07/06/2019 CLINICAL DATA:  59 year old diabetic male with right great toe infection. EXAM: RIGHT GREAT TOE COMPARISON:  None. FINDINGS: Abnormal soft tissue swelling about the right great toe with stranding but no definite soft tissue gas. The great toe phalanges appear intact with no cortical osteolysis identified. Degenerative changes at the 1st MTP and IP joints. No acute osseous abnormality identified. IMPRESSION: Soft tissue swelling with no plain radiographic evidence of osteomyelitis. Electronically Signed   By: Odessa Fleming M.D.   On: 07/06/2019 20:41        Scheduled Meds: Continuous Infusions:   LOS: 1 day    Time spent: 35 minutes    Pennie Banter, DO [facility]   If 7PM-7AM, please contact night-coverage [email or web address] 07/07/2019, 8:21 AM

## 2019-07-07 NOTE — Progress Notes (Signed)
PHARMACY -  BRIEF ANTIBIOTIC NOTE   Pharmacy has received consult(s) for Cefepime and Vancomycin from an ED provider.  The patient's profile has been reviewed for ht/wt/allergies/indication/available labs.    One time order(s) placed for Cefepime 2gm and Vancomycin 2gm (1gm + 1gm)  Further antibiotics/pharmacy consults should be ordered by admitting physician if indicated.                       Thank you, Valrie Hart A 07/07/2019  12:34 AM

## 2019-07-07 NOTE — Consult Note (Signed)
NAME: [name] [name]  DOB: [birth year]  MRN: [id number]  Date/Time: 07/07/2019 1:30 PM  REQUESTING PROVIDER: Dr.Griffith Subjective:  REASON FOR CONSULT: foot infection ? [name] [name] is a 59 y.o. male with a history of DM , chronic callus both feet and wound rt foot for  Along time presents with acute worsening over the past 3 days. Pt says he is in [employer] and has been working . Over the weekend he noted the rt great toe wound got worse with pain, and discharge which made him go to [facility] and they asked him to go to the ED and he came here yesterday He denies any fever , chills, trauma, cough, SOB Not been on any oral antibiotics recently Says he takes metformin for diabetes and last HBa1c was 7.3 Lives with wife and child Has 2 cats   Past Medical History:  Diagnosis Date  . Diabetes mellitus without complication (HCC)   . Hyperlipidemia   . Hypertension     Past Surgical History:  Procedure Laterality Date  . NO PAST SURGERIES      Social History   Socioeconomic History  . Marital status: Married    Spouse name: Not on file  . Number of children: Not on file  . Years of education: Not on file  . Highest education level: Not on file  Occupational History  . Not on file  Tobacco Use  . Smoking status: Never Smoker  . Smokeless tobacco: Never Used  Substance and Sexual Activity  . Alcohol use: Yes    Alcohol/week: 0.0 standard drinks    Comment: occasionally  . Drug use: No  . Sexual activity: Not on file  Other Topics Concern  . Not on file  Social History Narrative  . Not on file   . Fear of Current or Ex-Partner: Not on file  . Emotionally Abused: Not on file  . Physically Abused: Not on file  . Sexually Abused: Not on file    Family History  Problem Relation Age of Onset  . Hypertension Father    No Known Allergies ? Current Facility-Administered Medications  Medication Dose Route Frequency Provider Last Rate Last Admin  . 0.9 %  sodium  chloride infusion   Intravenous Continuous Rometta Emery, MD 100 mL/hr at 07/07/19 0954 New Bag at 07/07/19 0954  . acetaminophen (TYLENOL) tablet 650 mg  650 mg Oral Q6H PRN Rometta Emery, MD       Or  . acetaminophen (TYLENOL) suppository 650 mg  650 mg Rectal Q6H PRN Rometta Emery, MD      . aspirin EC tablet 81 mg  81 mg Oral Daily Rometta Emery, MD   81 mg at 07/07/19 0954  . enoxaparin (LOVENOX) injection 40 mg  40 mg Subcutaneous Q24H Garba, Mohammad L, MD      . insulin aspart (novoLOG) injection 0-15 Units  0-15 Units Subcutaneous TID WC Rometta Emery, MD   5 Units at 07/07/19 1129  . insulin aspart (novoLOG) injection 0-5 Units  0-5 Units Subcutaneous QHS Garba, Mohammad L, MD      . lisinopril (ZESTRIL) tablet 10 mg  10 mg Oral Daily Rometta Emery, MD   10 mg at 07/07/19 0954  . ondansetron (ZOFRAN) tablet 4 mg  4 mg Oral Q6H PRN Rometta Emery, MD       Or  . ondansetron (ZOFRAN) injection 4 mg  4 mg Intravenous Q6H PRN Rometta Emery, MD      .  simvastatin (ZOCOR) tablet 40 mg  40 mg Oral QHS Rometta Emery, MD       Current Outpatient Medications  Medication Sig Dispense Refill  . aspirin 81 MG tablet Take 1 tablet (81 mg total) by mouth daily. 30 tablet 0  . lisinopril (PRINIVIL,ZESTRIL) 10 MG tablet Take 1 tablet (10 mg total) by mouth daily. 90 tablet 3  . metFORMIN (GLUCOPHAGE) 1000 MG tablet Take 1 tablet (1,000 mg total) by mouth 2 (two) times daily with a meal. 180 tablet 3  . simvastatin (ZOCOR) 40 MG tablet Take 1 tablet (40 mg total) by mouth at bedtime. 90 tablet 3  . b complex vitamins tablet Take 1 tablet by mouth daily.       Abtx:  Anti-infectives (From admission, onward)   Start     Dose/Rate Route Frequency Ordered Stop   07/07/19 0045  vancomycin (VANCOCIN) IVPB 1000 mg/200 mL premix     1,000 mg 200 mL/hr over 60 Minutes Intravenous  Once 07/07/19 0034 07/07/19 0539   07/07/19 0000  ceFEPIme (MAXIPIME) 2 g in sodium  chloride 0.9 % 100 mL IVPB     2 g 200 mL/hr over 30 Minutes Intravenous  Once 07/06/19 2354 07/07/19 0038   07/06/19 2145  piperacillin-tazobactam (ZOSYN) IVPB 3.375 g     3.375 g 100 mL/hr over 30 Minutes Intravenous  Once 07/06/19 2134 07/06/19 2300   07/06/19 2030  vancomycin (VANCOCIN) IVPB 1000 mg/200 mL premix     1,000 mg 200 mL/hr over 60 Minutes Intravenous  Once 07/06/19 2022 07/06/19 2149      REVIEW OF SYSTEMS:  Const: negative fever, negative chills, negative weight loss Eyes: negative diplopia or visual changes, negative eye pain ENT: negative coryza, negative sore throat Resp: negative cough, hemoptysis, dyspnea Cards: negative for chest pain, palpitations, lower extremity edema GU: negative for frequency, dysuria and hematuria GI: Negative for abdominal pain, diarrhea, bleeding, constipation Skin: negative for rash and pruritus Heme: negative for easy bruising and gum/nose bleeding MS: negative for myalgias, arthralgias, back pain and muscle weakness Neurolo:negative for headaches, dizziness, vertigo, memory problems  Psych: negative for feelings of anxiety, depression  Endocrine: DM Allergy/Immunology- negative for any medication or food allergies  Objective:  VITALS:  BP (!) 165/87 (BP Location: Left Arm)   Pulse 85   Temp 97.9 F (36.6 C) (Oral)   Resp 18   Ht 6' (1.829 m)   Wt 133.4 kg   SpO2 98%   BMI 39.87 kg/m  PHYSICAL EXAM:  General: Alert, cooperative, no distress, appears stated age.  Head: Normocephalic, without obvious abnormality, atraumatic. Eyes: Conjunctivae clear, anicteric sclerae. Pupils are equal ENT Nares normal. No drainage or sinus tenderness. Lips, mucosa, and tongue normal. No Thrush Neck: Supple, symmetrical, no adenopathy, thyroid: non tender no carotid bruit and no JVD. Back: No CVA tenderness. Lungs: Clear to auscultation bilaterally. No Wheezing or Rhonchi. No rales. Heart: Regular rate and rhythm, no murmur, rub or  gallop. Abdomen: Soft, non-tender,not distended. Bowel sounds normal. No masses Extremities: rt great toe- necrotic wound   Left foot- debrided callus on the plantar aspect of the great toe Skin: No rashes or lesions. Or bruising Lymph: Cervical, supraclavicular normal. Neurologic: Grossly non-focal Pertinent Labs Lab Results CBC    Component Value Date/Time   WBC 14.4 (H) 07/06/2019 2022   RBC 4.27 07/06/2019 2022   HGB 12.8 (L) 07/06/2019 2022   HCT 38.1 (L) 07/06/2019 2022   PLT 330 07/06/2019 2022  MCV 89.2 07/06/2019 2022   MCH 30.0 07/06/2019 2022   MCHC 33.6 07/06/2019 2022   RDW 12.7 07/06/2019 2022   LYMPHSABS 3.0 07/06/2019 2022   MONOABS 1.5 (H) 07/06/2019 2022   EOSABS 0.5 07/06/2019 2022   BASOSABS 0.1 07/06/2019 2022    CMP Latest Ref Rng & Units 07/06/2019 06/26/2015  Glucose 70 - 99 mg/dL 159(H) 177(H)  BUN 6 - 20 mg/dL 23(H) 16  Creatinine 0.61 - 1.24 mg/dL 1.18 0.94  Sodium 135 - 145 mmol/L 134(L) 142  Potassium 3.5 - 5.1 mmol/L 4.6 4.1  Chloride 98 - 111 mmol/L 98 97  CO2 22 - 32 mmol/L 29 24  Calcium 8.9 - 10.3 mg/dL 9.2 10.0  Total Protein 6.5 - 8.1 g/dL 7.4 7.5  Total Bilirubin 0.3 - 1.2 mg/dL 0.8 0.5  Alkaline Phos 38 - 126 U/L 55 64  AST 15 - 41 U/L 12(L) 15  ALT 0 - 44 U/L 13 20      Microbiology: Recent Results (from the past 240 hour(s))  Respiratory Panel by RT PCR (Flu A&B, Covid) - Nasopharyngeal Swab     Status: None   Collection Time: 07/06/19  9:43 PM   Specimen: Nasopharyngeal Swab  Result Value Ref Range Status   SARS Coronavirus 2 by RT PCR NEGATIVE NEGATIVE Final    Comment: (NOTE) SARS-CoV-2 target nucleic acids are NOT DETECTED. The SARS-CoV-2 RNA is generally detectable in upper respiratoy specimens during the acute phase of infection. The lowest concentration of SARS-CoV-2 viral copies this assay can detect is 131 copies/mL. A negative result does not preclude SARS-Cov-2 infection and should not be used as the sole  basis for treatment or other patient management decisions. A negative result may occur with  improper specimen collection/handling, submission of specimen other than nasopharyngeal swab, presence of viral mutation(s) within the areas targeted by this assay, and inadequate number of viral copies (<131 copies/mL). A negative result must be combined with clinical observations, patient history, and epidemiological information. The expected result is Negative. Fact Sheet for Patients:  PinkCheek.be Fact Sheet for Healthcare Providers:  GravelBags.it This test is not yet ap proved or cleared by the [place] FDA and  has been authorized for detection and/or diagnosis of SARS-CoV-2 by FDA under an Emergency Use Authorization (EUA). This EUA will remain  in effect (meaning this test can be used) for the duration of the COVID-19 declaration under Section 564(b)(1) of the Act, 21 U.S.C. section 360bbb-3(b)(1), unless the authorization is terminated or revoked sooner.    Influenza A by PCR NEGATIVE NEGATIVE Final   Influenza B by PCR NEGATIVE NEGATIVE Final    Comment: (NOTE) The Xpert Xpress SARS-CoV-2/FLU/RSV assay is intended as an aid in  the diagnosis of influenza from Nasopharyngeal swab specimens and  should not be used as a sole basis for treatment. Nasal washings and  aspirates are unacceptable for Xpert Xpress SARS-CoV-2/FLU/RSV  testing. Fact Sheet for Patients: PinkCheek.be Fact Sheet for Healthcare Providers: GravelBags.it This test is not yet approved or cleared by the [place] FDA and  has been authorized for detection and/or diagnosis of SARS-CoV-2 by  FDA under an Emergency Use Authorization (EUA). This EUA will remain  in effect (meaning this test can be used) for the duration of the  Covid-19 declaration under Section 564(b)(1) of the Act, 21  U.S.C.  section 360bbb-3(b)(1), unless the authorization is  terminated or revoked. Performed at [facility], [address] [place], [place] [address]  IMAGING RESULTS: MRI foot Osteomyelitis of the distal phalanx of the great toe with 2 overlying soft tissue ulcers that extend to the bone.2. 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 3. Tenosynovitis of the flexor tendons of the first, second and third toes.4. Nonspecific edema and enhancement of the flexor digitorum muscles in the arch of the foot. I have personally reviewed the films ? Impression/Recommendation ? ?Necrotic wound of the rt great toe- Underlying osteomyelitis Will need surgical intervention Seen by dr.Baker -plan for surgery tomorrow/friday Will continue vanco/cefepime and flagyl  Diabetes mellitus - is contributing to the wound and poor healing  Await cultures ? ___________________________________________________ Discussed with patient

## 2019-07-07 NOTE — Consult Note (Signed)
PODIATRY / FOOT AND ANKLE SURGERY CONSULTATION NOTE  Requesting Physician: Esaw Grandchild, DO  Reason for consult: R hallux infection  Chief Complaint: R big toe infection   HPI: [name] [name] is a 59 y.o. male who presents with a nonhealing ulceration to his right big toe.  Patient also states that he has ulcerations to his left foot.  He states that these ulcerations were getting better and the left foot was nearly healed at one point but they continue to reopen.  He notes that he really has not been caring for these wounds as much as he should have.  He states he has been getting treatment from his primary care physician for these wounds.  He states that he is diabetic and his blood sugars have been fairly well controlled lately between 100-200.  He states that recently though his blood sugars have been elevated likely due to infection.  He notes increased redness and swelling to his right big toe along with some purulent type drainage.  He notes that the right big toe has greatly increased in size over the past couple of days.  He was subsequently admitted to the hospital for further evaluation and potential amputation.  An MRI was performed during his stay showing osteomyelitic changes to the distal phalanx and proximal phalanx as well as severe arthritic changes to the first metatarsal phalangeal joint.  Patient currently denies nausea, vomiting, fever, chills.  PMHx:  Past Medical History:  Diagnosis Date  . Diabetes mellitus without complication (HCC)   . Hyperlipidemia   . Hypertension     Surgical Hx:  Past Surgical History:  Procedure Laterality Date  . NO PAST SURGERIES      FHx:  Family History  Problem Relation Age of Onset  . Hypertension Father     Social History:  reports that he has never smoked. He has never used smokeless tobacco. He reports current alcohol use. He reports that he does not use drugs.  Allergies: No Known Allergies  Review of Systems: General  ROS: negative Respiratory ROS: no cough, shortness of breath, or wheezing Cardiovascular ROS: no chest pain or dyspnea on exertion Musculoskeletal ROS: positive for - joint stiffness and joint swelling Neurological ROS: positive for - numbness/tingling Dermatological ROS: positive for Right big toe wound, left big toe wound, left plantar foot wound   Physical Exam: General: Alert and oriented.  No apparent distress.  Vascular: DP/PT pulses +1 bilateral.  Patient has some hair growth to digits of foot bilateral.  Capillary fill time less than 3 seconds to digits bilaterally.  Gross edematous and erythematous changes to the right hallux to the level of the first metatarsal phalangeal joint.  Neuro: Light touch sensation absent to bilateral lower extremities.  Derm: 1.  Right IPJ hallux ulceration: Wound bed appears to be fibronecrotic with depth to the level of bone at the IPJ hallux, appears to have gangrenous type extension to the dorsal aspect of the toe to the level of the nail plate at the dorsal medial aspect.  Associated erythema and edema present with gross enlargement of the toe that extends close to the MTPJ.  No streaking erythema present after that.  Moderate odor noted.  Mild purulence able to be expressed.  2.  Left IPJ hallux ulceration: Wound bed after debridement appears to be granular after removing all hyperkeratotic tissue present, probes to subcutaneous tissue only, no drainage, no odor, no erythema or edema.  3.  Left plantar third metatarsal phalangeal joint ulceration:  Wound bed after debridement appears to be granular after removing all hyperkeratotic and fibrous debris present, probes to the subcutaneous tissue only, no drainage, minimal odor, no erythema or edema.  MSK: No pain on palpation of the ulceration sites are listed above.  Limited first metatarsal phalangeal joint dorsiflexion bilaterally loaded and unloaded.  Hallux hyperextension noted  bilaterally.  Results for orders placed or performed during the hospital encounter of 07/06/19 (from the past 48 hour(s))  CBC with Differential/Platelet     Status: Abnormal   Collection Time: 07/06/19  8:22 PM  Result Value Ref Range   WBC 14.4 (H) 4.0 - 10.5 K/uL   RBC 4.27 4.22 - 5.81 MIL/uL   Hemoglobin 12.8 (L) 13.0 - 17.0 g/dL   HCT 12.8 (L) 78.6 - 76.7 %   MCV 89.2 80.0 - 100.0 fL   MCH 30.0 26.0 - 34.0 pg   MCHC 33.6 30.0 - 36.0 g/dL   RDW 20.9 47.0 - 96.2 %   Platelets 330 150 - 400 K/uL   nRBC 0.0 0.0 - 0.2 %   Neutrophils Relative % 64 %   Neutro Abs 9.3 (H) 1.7 - 7.7 K/uL   Lymphocytes Relative 21 %   Lymphs Abs 3.0 0.7 - 4.0 K/uL   Monocytes Relative 10 %   Monocytes Absolute 1.5 (H) 0.1 - 1.0 K/uL   Eosinophils Relative 4 %   Eosinophils Absolute 0.5 0.0 - 0.5 K/uL   Basophils Relative 0 %   Basophils Absolute 0.1 0.0 - 0.1 K/uL   Immature Granulocytes 1 %   Abs Immature Granulocytes 0.13 (H) 0.00 - 0.07 K/uL    Comment: Performed at [facility], [address] [place], [place] [address]  Comprehensive metabolic panel     Status: Abnormal   Collection Time: 07/06/19  8:22 PM  Result Value Ref Range   Sodium 134 (L) 135 - 145 mmol/L   Potassium 4.6 3.5 - 5.1 mmol/L   Chloride 98 98 - 111 mmol/L   CO2 29 22 - 32 mmol/L   Glucose, Bld 159 (H) 70 - 99 mg/dL   BUN 23 (H) 6 - 20 mg/dL   Creatinine, Ser 9.47 0.61 - 1.24 mg/dL   Calcium 9.2 8.9 - 65.4 mg/dL   Total Protein 7.4 6.5 - 8.1 g/dL   Albumin 3.8 3.5 - 5.0 g/dL   AST 12 (L) 15 - 41 U/L   ALT 13 0 - 44 U/L   Alkaline Phosphatase 55 38 - 126 U/L   Total Bilirubin 0.8 0.3 - 1.2 mg/dL   GFR calc non Af Amer >60 >60 mL/min   GFR calc Af Amer >60 >60 mL/min   Anion gap 7 5 - 15    Comment: Performed at [facility], [address] [place], [place] [address]  Sedimentation rate     Status: Abnormal   Collection Time: 07/06/19  8:22 PM  Result Value Ref Range   Sed Rate 67 (H) 0 - 20  mm/hr    Comment: Performed at [facility], [address] [place], [place] [address]  Respiratory Panel by RT PCR (Flu A&B, Covid) - Nasopharyngeal Swab     Status: None   Collection Time: 07/06/19  9:43 PM   Specimen: Nasopharyngeal Swab  Result Value Ref Range   SARS Coronavirus 2 by RT PCR NEGATIVE NEGATIVE    Comment: (NOTE) SARS-CoV-2 target nucleic acids are NOT DETECTED. The SARS-CoV-2 RNA is generally detectable in upper respiratoy specimens during  the acute phase of infection. The lowest concentration of SARS-CoV-2 viral copies this assay can detect is 131 copies/mL. A negative result does not preclude SARS-Cov-2 infection and should not be used as the sole basis for treatment or other patient management decisions. A negative result may occur with  improper specimen collection/handling, submission of specimen other than nasopharyngeal swab, presence of viral mutation(s) within the areas targeted by this assay, and inadequate number of viral copies (<131 copies/mL). A negative result must be combined with clinical observations, patient history, and epidemiological information. The expected result is Negative. Fact Sheet for Patients:  https://www.moore.com/ Fact Sheet for Healthcare Providers:  https://www.young.biz/ This test is not yet ap proved or cleared by the [place] FDA and  has been authorized for detection and/or diagnosis of SARS-CoV-2 by FDA under an Emergency Use Authorization (EUA). This EUA will remain  in effect (meaning this test can be used) for the duration of the COVID-19 declaration under Section 564(b)(1) of the Act, 21 U.S.C. section 360bbb-3(b)(1), unless the authorization is terminated or revoked sooner.    Influenza A by PCR NEGATIVE NEGATIVE   Influenza B by PCR NEGATIVE NEGATIVE    Comment: (NOTE) The Xpert Xpress SARS-CoV-2/FLU/RSV assay is intended as an aid in  the diagnosis of  influenza from Nasopharyngeal swab specimens and  should not be used as a sole basis for treatment. Nasal washings and  aspirates are unacceptable for Xpert Xpress SARS-CoV-2/FLU/RSV  testing. Fact Sheet for Patients: https://www.moore.com/ Fact Sheet for Healthcare Providers: https://www.young.biz/ This test is not yet approved or cleared by the [place] FDA and  has been authorized for detection and/or diagnosis of SARS-CoV-2 by  FDA under an Emergency Use Authorization (EUA). This EUA will remain  in effect (meaning this test can be used) for the duration of the  Covid-19 declaration under Section 564(b)(1) of the Act, 21  U.S.C. section 360bbb-3(b)(1), unless the authorization is  terminated or revoked. Performed at [facility], [address] [place], [place] [address]   Glucose, capillary     Status: Abnormal   Collection Time: 07/06/19 10:40 PM  Result Value Ref Range   Glucose-Capillary 160 (H) 70 - 99 mg/dL   MR FOOT RIGHT W WO CONTRAST  Result Date: 07/07/2019 CLINICAL DATA:  Diabetic ulcer on the medial aspect of the right great toe. Swelling. EXAM: MRI OF THE RIGHT FOREFOOT WITHOUT AND WITH CONTRAST TECHNIQUE: Multiplanar, multisequence MR imaging of the right forefoot was performed before and after the administration of intravenous contrast. CONTRAST:  38mL GADAVIST GADOBUTROL 1 MMOL/ML IV SOLN COMPARISON:  Radiographs dated 07/06/2019 FINDINGS: Bones/Joint/Cartilage There is abnormal edema in the distal phalanx of the great toe with abnormal low signal on T1 imaging and diffuse enhancement of the distal phalanx of the great toe on postcontrast imaging consistent with osteomyelitis. There is severe arthritis of the first MTP joint with less severe arthritis of the second MTP joint. There are subcortical cysts and erosions of the heads of the first and second metatarsals. Does the patient have a history of gout? Minimal  effusions at the first and second MTP joints. Muscles and Tendons Small amount of fluid and enhancement in the flexor hallucis longus tendon sheath and in the flexor tendon a sheath of the second and third toes at the level of the metatarsals. Slight edema and enhancement of the flexor digitorum muscles in the arch of the foot, nonspecific. Soft tissues Soft tissue ulcerations on the plantar aspect of the great toe  as well as an ulceration on the medial dorsal aspect of the distal great toe. Both of these extend to the distal phalangeal bone. Edema on the dorsum of the foot, nonspecific. IMPRESSION: 1. Osteomyelitis of the distal phalanx of the great toe with 2 overlying soft tissue ulcers that extend to the bone. 2. 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 3. Tenosynovitis of the flexor tendons of the first, second and third toes. 4. Nonspecific edema and enhancement of the flexor digitorum muscles in the arch of the foot. Electronically Signed   By: Lorriane Shire M.D.   On: 07/07/2019 08:08   DG Toe Great Right  Result Date: 07/06/2019 CLINICAL DATA:  59 year old diabetic male with right great toe infection. EXAM: RIGHT GREAT TOE COMPARISON:  None. FINDINGS: Abnormal soft tissue swelling about the right great toe with stranding but no definite soft tissue gas. The great toe phalanges appear intact with no cortical osteolysis identified. Degenerative changes at the 1st MTP and IP joints. No acute osseous abnormality identified. IMPRESSION: Soft tissue swelling with no plain radiographic evidence of osteomyelitis. Electronically Signed   By: Genevie Ann M.D.   On: 07/06/2019 20:41    Blood pressure (!) 165/87, pulse 85, temperature 97.9 F (36.6 C), temperature source Oral, resp. rate 18, height 6' (1.829 m), weight 133.4 kg, SpO2 98 %.  Assessment 1. Osteomyelitis right hallux with cellulitic changes secondary to right IPJ hallux ulceration with necrosis of bone 2. Left hallux IPJ plantar medial  ulceration, subcutaneous 3. Left third metatarsal phalangeal joint ulceration, subcutaneous 4. Diabetes type 2 polyneuropathy 5. PVD  Plan -Patient seen and examined today. -100% excisional subcutaneous wound debridement performed to the IPJ hallux ulceration bilateral, and plantar third metatarsal phalangeal joint.  No signs of infection noted to the left foot ulcerations and wound beds appear to be granular and healthy overall after debridement removing all hyperkeratotic tissue present.  Right foot on the other hand at the IPJ hallux has depth to bone and appears to have necrotic areas even after debridement, wound bed appears to be fibronecrotic and has cellulitic changes present at the distal aspect of the toe extending to the metatarsal phalangeal joint.  Wound culture taken from this area and sent off. -MRI and x-ray imaging reviewed and discussed with patient in detail.  Osteomyelitic changes present to the right hallux at the distal phalanx, IPJ hallux, and proximal phalanx head.  Discussed with patient that the treatment options for this condition consist of IV antibiotics versus amputation.  Discussed very low success rate with amputation due to fibronecrotic changes present within the wound. -All treatment options were discussed with the patient both conservative and surgical attempts at correction including potential risks and complications of surgical intervention.  Once the benefits and complications of surgery were discussed, the patient has elected for surgical intervention consisting of right partial first ray amputation.  Discussed with patient that we will try to leave as much of the toe as possible but if the tissues appear to be unhealthy in this area we could potentially perform a complete hallux amputation or potentially a partial first ray removing the metatarsal head for adequate closure.  Patient understands and is agreeable. -X-ray imaging ordered for the left foot. -ABIs  ordered for bilateral lower extremities.  If disease present then would recommend vascular consultation for potential revascularization procedure.  Believe likely his flow should be adequate deals capillary fill time appears to be intact and pulses are faintly palpable and hair growth  is present to digits. -Ordered surgical shoes for both feet.  Patient to be partial weightbearing with heel contact only to both feet. -Appreciate recommendations for antibiotics.  Infectious disease has been consulted. -N.p.o. order placed for midnight on 07/08/2019 for surgical procedure.  Plan for patient procedure sometime between 11 AM and 1 PM on 07/08/2019, weather pending.  Could potentially move procedure to the afternoon on 07/09/2019. -Dressing changes ordered.  Rosetta Posner, DPM 07/07/2019, 1:24 PM

## 2019-07-07 NOTE — Anesthesia Preprocedure Evaluation (Deleted)
Anesthesia Evaluation    Airway        Dental   Pulmonary           Cardiovascular hypertension,      Neuro/Psych    GI/Hepatic   Endo/Other  diabetes  Renal/GU      Musculoskeletal   Abdominal   Peds  Hematology   Anesthesia Other Findings Past Medical History: No date: Diabetes mellitus without complication (HCC) No date: Hyperlipidemia No date: Hypertension   Reproductive/Obstetrics                             Anesthesia Physical Anesthesia Plan Anesthesia Quick Evaluation

## 2019-07-07 NOTE — Consult Note (Signed)
Pharmacy Antibiotic Note  [name] [name] is a 59 y.o. male admitted on 07/06/2019 with necrotic wound of right great toe with underlying osteomyelitis.  MRI shows osteomyelitic changes to distal phalanx and proximal phalanx as well as sever arthritic changes to the first metatarsal phalangeal joint. Plan is for surgical intervention with Dr. Excell Seltzer either tomorrow or 2/19(weather permitting). Pharmacy has been consulted for Vancomycin dosing. Patient is also on Cefepime.  Plan: Patient received Vancomycin 2g IV total as loading dose. Will order Vancomycin 1500 mg IV Q 24 hrs. Goal AUC 400-550. Expected AUC: 465.5 Expected Css: 14.0 SCr used: 1.17  Height: 6' (182.9 cm) Weight: 294 lb (133.4 kg) IBW/kg (Calculated) : 77.6  Temp (24hrs), Avg:97.8 F (36.6 C), Min:97.7 F (36.5 C), Max:97.9 F (36.6 C)  Recent Labs  Lab 07/06/19 2022 07/07/19 1827  WBC 14.4* 11.6*  CREATININE 1.18 1.17    Estimated Creatinine Clearance: 97.2 mL/min (by C-G formula based on SCr of 1.17 mg/dL).    No Known Allergies  Antimicrobials this admission: Vancomycin 2/16 >>  Cefepime 2/17 >>  Zosyn x 1  Microbiology results: 2/17 Wound Cx: pending   Thank you for allowing pharmacy to be a part of this patient's care.  Bettey Costa 07/07/2019 7:35 PM

## 2019-07-07 NOTE — Progress Notes (Signed)
Report given to [facility] RN. Patient at ultrasound at this time. When patient returns will transport to room.

## 2019-07-08 ENCOUNTER — Inpatient Hospital Stay: Payer: Self-pay | Admitting: Anesthesiology

## 2019-07-08 ENCOUNTER — Inpatient Hospital Stay: Payer: Self-pay

## 2019-07-08 ENCOUNTER — Encounter
Admission: EM | Disposition: A | Discharge status: Home / Self Care | Payer: Self-pay | Source: Ambulatory Visit | Attending: Family Medicine

## 2019-07-08 ENCOUNTER — Encounter: Payer: Self-pay | Admitting: Internal Medicine

## 2019-07-08 DIAGNOSIS — L089 Local infection of the skin and subcutaneous tissue, unspecified: Secondary | ICD-10-CM

## 2019-07-08 DIAGNOSIS — E11628 Type 2 diabetes mellitus with other skin complications: Secondary | ICD-10-CM

## 2019-07-08 HISTORY — PX: AMPUTATION: SHX166

## 2019-07-08 LAB — BASIC METABOLIC PANEL
Anion gap: 10 (ref 5–15)
BUN: 19 mg/dL (ref 6–20)
CO2: 26 mmol/L (ref 22–32)
Calcium: 9 mg/dL (ref 8.9–10.3)
Chloride: 100 mmol/L (ref 98–111)
Creatinine, Ser: 1.1 mg/dL (ref 0.61–1.24)
GFR calc Af Amer: >60 mL/min (ref >60)
GFR calc non Af Amer: >60 mL/min (ref >60)
Glucose, Bld: 214 mg/dL — ABNORMAL HIGH (ref 70–99)
Potassium: 4.6 mmol/L (ref 3.5–5.1)
Sodium: 136 mmol/L (ref 135–145)

## 2019-07-08 LAB — CBC
HCT: 40.4 % (ref 39.0–52.0)
Hemoglobin: 13.3 g/dL (ref 13.0–17.0)
MCH: 30 pg (ref 26.0–34.0)
MCHC: 32.9 g/dL (ref 30.0–36.0)
MCV: 91.2 fL (ref 80.0–100.0)
Platelets: 333 10*3/uL (ref 150–400)
RBC: 4.43 MIL/uL (ref 4.22–5.81)
RDW: 12.7 % (ref 11.5–15.5)
WBC: 12.4 10*3/uL — ABNORMAL HIGH (ref 4.0–10.5)
nRBC: 0 % (ref 0.0–0.2)

## 2019-07-08 LAB — HEMOGLOBIN A1C
Hgb A1c MFr Bld: 8.7 % — ABNORMAL HIGH (ref 4.8–5.6)
Mean Plasma Glucose: 202.99 mg/dL

## 2019-07-08 LAB — HIV ANTIBODY (ROUTINE TESTING W REFLEX): HIV Screen 4th Generation wRfx: NONREACTIVE

## 2019-07-08 LAB — GLUCOSE, CAPILLARY
Glucose-Capillary: 189 mg/dL — ABNORMAL HIGH (ref 70–99)
Glucose-Capillary: 146 mg/dL — ABNORMAL HIGH (ref 70–99)
Glucose-Capillary: 177 mg/dL — ABNORMAL HIGH (ref 70–99)
Glucose-Capillary: 298 mg/dL — ABNORMAL HIGH (ref 70–99)
Glucose-Capillary: 210 mg/dL — ABNORMAL HIGH (ref 70–99)

## 2019-07-08 SURGERY — AMPUTATION, FOOT, RAY
Anesthesia: General | Site: Toe | Laterality: Right

## 2019-07-08 MED ORDER — BUPIVACAINE HCL (PF) 0.5 % IJ SOLN
INTRAMUSCULAR | Status: AC
  Filled 2019-07-08: qty 30

## 2019-07-08 MED ORDER — VANCOMYCIN HCL IN DEXTROSE 1-5 GM/200ML-% IV SOLN
1000.0000 mg | Freq: Two times a day (BID) | INTRAVENOUS | Status: DC
  Administered 2019-07-08 – 2019-07-10 (×4): 1000 mg via INTRAVENOUS
  Filled 2019-07-08 (×7): qty 200

## 2019-07-08 MED ORDER — PROPOFOL 10 MG/ML IV BOLUS
INTRAVENOUS | Status: AC
  Filled 2019-07-08: qty 20

## 2019-07-08 MED ORDER — SODIUM CHLORIDE 0.9 % IV SOLN
INTRAVENOUS | Status: DC | PRN
  Administered 2019-07-08: 30 mL via INTRAVENOUS

## 2019-07-08 MED ORDER — FENTANYL CITRATE (PF) 100 MCG/2ML IJ SOLN
INTRAMUSCULAR | Status: DC | PRN
  Administered 2019-07-08: 50 ug via INTRAVENOUS

## 2019-07-08 MED ORDER — BUPIVACAINE HCL 0.5 % IJ SOLN
INTRAMUSCULAR | Status: DC | PRN
  Administered 2019-07-08: 30 mL

## 2019-07-08 MED ORDER — NEOMYCIN-POLYMYXIN B GU 40-200000 IR SOLN
Status: AC
  Filled 2019-07-08: qty 20

## 2019-07-08 MED ORDER — FENTANYL CITRATE (PF) 100 MCG/2ML IJ SOLN: 25.0000 ug | INTRAMUSCULAR | Status: DC | PRN

## 2019-07-08 MED ORDER — PHENYLEPHRINE HCL (PRESSORS) 10 MG/ML IV SOLN
INTRAVENOUS | Status: DC | PRN
  Administered 2019-07-08: 200 ug via INTRAVENOUS
  Administered 2019-07-08: 100 ug via INTRAVENOUS
  Administered 2019-07-08: 200 ug via INTRAVENOUS
  Administered 2019-07-08 (×2): 100 ug via INTRAVENOUS

## 2019-07-08 MED ORDER — ONDANSETRON HCL 4 MG/2ML IJ SOLN
INTRAMUSCULAR | Status: AC
  Filled 2019-07-08: qty 2

## 2019-07-08 MED ORDER — PROMETHAZINE HCL 25 MG/ML IJ SOLN: 6.2500 mg | INTRAMUSCULAR | Status: DC | PRN

## 2019-07-08 MED ORDER — ONDANSETRON HCL 4 MG/2ML IJ SOLN
INTRAMUSCULAR | Status: DC | PRN
  Administered 2019-07-08: 4 mg via INTRAVENOUS

## 2019-07-08 MED ORDER — MIDAZOLAM HCL 2 MG/2ML IJ SOLN
INTRAMUSCULAR | Status: DC | PRN
  Administered 2019-07-08: 2 mg via INTRAVENOUS

## 2019-07-08 MED ORDER — PROPOFOL 10 MG/ML IV BOLUS
INTRAVENOUS | Status: DC | PRN
  Administered 2019-07-08: 200 mg via INTRAVENOUS

## 2019-07-08 MED ORDER — EPHEDRINE SULFATE 50 MG/ML IJ SOLN
INTRAMUSCULAR | Status: DC | PRN
  Administered 2019-07-08: 5 mg via INTRAVENOUS
  Administered 2019-07-08: 10 mg via INTRAVENOUS
  Administered 2019-07-08 (×2): 5 mg via INTRAVENOUS

## 2019-07-08 MED ORDER — LIDOCAINE HCL (CARDIAC) PF 100 MG/5ML IV SOSY
PREFILLED_SYRINGE | INTRAVENOUS | Status: DC | PRN
  Administered 2019-07-08: 100 mg via INTRAVENOUS

## 2019-07-08 MED ORDER — FENTANYL CITRATE (PF) 100 MCG/2ML IJ SOLN
INTRAMUSCULAR | Status: AC
  Filled 2019-07-08: qty 2

## 2019-07-08 MED ORDER — MIDAZOLAM HCL 2 MG/2ML IJ SOLN
INTRAMUSCULAR | Status: AC
  Filled 2019-07-08: qty 2

## 2019-07-08 MED ORDER — VANCOMYCIN HCL 1000 MG IV SOLR
INTRAVENOUS | Status: AC
  Filled 2019-07-08: qty 1000

## 2019-07-08 SURGICAL SUPPLY — 47 items
BLADE MED AGGRESSIVE (BLADE) ×3 IMPLANT
BLADE OSC/SAGITTAL MD 5.5X18 (BLADE) IMPLANT
BLADE SURG 15 STRL LF DISP TIS (BLADE) IMPLANT
BLADE SURG 15 STRL SS (BLADE)
BLADE SURG MINI STRL (BLADE) IMPLANT
BNDG CONFORM 2 STRL LF (GAUZE/BANDAGES/DRESSINGS) IMPLANT
BNDG ELASTIC 4X5.8 VLCR STR LF (GAUZE/BANDAGES/DRESSINGS) ×5 IMPLANT
BNDG ESMARK 4X12 TAN STRL LF (GAUZE/BANDAGES/DRESSINGS) ×3 IMPLANT
BNDG GAUZE 4.5X4.1 6PLY STRL (MISCELLANEOUS) ×5 IMPLANT
CANISTER SUCT 1200ML W/VALVE (MISCELLANEOUS) ×3 IMPLANT
CNTNR SPEC 2.5X3XGRAD LEK (MISCELLANEOUS) ×2
CONT SPEC 4OZ STER OR WHT (MISCELLANEOUS) ×4
CONTAINER SPEC 2.5X3XGRAD LEK (MISCELLANEOUS) ×1 IMPLANT
COVER WAND RF STERILE (DRAPES) ×3 IMPLANT
CUFF TOURN 18 STER (MISCELLANEOUS) IMPLANT
CUFF TOURN DUAL PL 12 NO SLV (MISCELLANEOUS) IMPLANT
DRAPE FLUOR MINI C-ARM 54X84 (DRAPES) IMPLANT
DURAPREP 26ML APPLICATOR (WOUND CARE) ×3 IMPLANT
ELECT REM PT RETURN 9FT ADLT (ELECTROSURGICAL) ×3
ELECTRODE REM PT RTRN 9FT ADLT (ELECTROSURGICAL) ×1 IMPLANT
GAUZE SPONGE 4X4 12PLY STRL (GAUZE/BANDAGES/DRESSINGS) ×6 IMPLANT
GAUZE XEROFORM 1X8 LF (GAUZE/BANDAGES/DRESSINGS) ×3 IMPLANT
GLOVE BIO SURGEON STRL SZ7 (GLOVE) ×3 IMPLANT
GLOVE INDICATOR 7.0 STRL GRN (GLOVE) ×3 IMPLANT
GOWN STRL REUS W/ TWL LRG LVL3 (GOWN DISPOSABLE) ×2 IMPLANT
GOWN STRL REUS W/TWL LRG LVL3 (GOWN DISPOSABLE) ×4
HANDPIECE VERSAJET DEBRIDEMENT (MISCELLANEOUS) IMPLANT
KIT TURNOVER KIT A (KITS) ×3 IMPLANT
LABEL OR SOLS (LABEL) ×3 IMPLANT
NDL FILTER BLUNT 18X1 1/2 (NEEDLE) ×1 IMPLANT
NDL HYPO 25X1 1.5 SAFETY (NEEDLE) ×2 IMPLANT
NEEDLE FILTER BLUNT 18X 1/2SAF (NEEDLE) ×2
NEEDLE FILTER BLUNT 18X1 1/2 (NEEDLE) ×1 IMPLANT
NEEDLE HYPO 25X1 1.5 SAFETY (NEEDLE) ×6 IMPLANT
NS IRRIG 500ML POUR BTL (IV SOLUTION) ×3 IMPLANT
PACK EXTREMITY ARMC (MISCELLANEOUS) ×3 IMPLANT
SOL .9 NS 3000ML IRR  AL (IV SOLUTION)
SOL .9 NS 3000ML IRR UROMATIC (IV SOLUTION) IMPLANT
SOL PREP PVP 2OZ (MISCELLANEOUS)
SOLUTION PREP PVP 2OZ (MISCELLANEOUS) ×1 IMPLANT
STOCKINETTE STRL 6IN 960660 (GAUZE/BANDAGES/DRESSINGS) ×3 IMPLANT
SUT ETHILON 3-0 FS-10 30 BLK (SUTURE) ×9
SUT VIC AB 3-0 SH 27 (SUTURE) ×2
SUT VIC AB 3-0 SH 27X BRD (SUTURE) ×1 IMPLANT
SUTURE EHLN 3-0 FS-10 30 BLK (SUTURE) ×2 IMPLANT
SWAB CULTURE AMIES ANAERIB BLU (MISCELLANEOUS) IMPLANT
SYR 10ML LL (SYRINGE) ×3 IMPLANT

## 2019-07-08 NOTE — H&P (Signed)
HISTORY AND PHYSICAL INTERVAL NOTE:  07/08/2019  11:12 AM  [name] [name]  has presented today for surgery, with the diagnosis of RIGHT HALLUX OSTEOMYELITIS.  The various methods of treatment have been discussed with the patient.  No guarantees were given.  After consideration of risks, benefits and other options for treatment, the patient has consented to surgery.  I have reviewed the patients' chart and labs.    PROCEDURE: RIGHT PARTIAL FIRST RAY AMPUTATION  A history and physical examination was performed in my office.  The patient was reexamined.  There have been no changes to this history and physical examination.  Rosetta Posner, DPM

## 2019-07-08 NOTE — Transfer of Care (Signed)
Immediate Anesthesia Transfer of Care Note  Patient: [name] [name]  Procedure(s) Performed: AMPUTATION RAY, PARTIAL RIGHT 1ST (Right Toe)  Patient Location: PACU  Anesthesia Type:General  Level of Consciousness: awake, alert  and oriented  Airway & Oxygen Therapy: Patient Spontanous Breathing and Patient connected to face mask oxygen  Post-op Assessment: Report given to RN and Post -op Vital signs reviewed and stable  Post vital signs: Reviewed and stable  Last Vitals:  Vitals Value Taken Time  BP 111/68 07/08/19 1217  Temp 36.4 C 07/08/19 1217  Pulse 101 07/08/19 1219  Resp 20 07/08/19 1219  SpO2 99 % 07/08/19 1219  Vitals shown include unvalidated device data.  Last Pain:  Vitals:   07/08/19 1053  TempSrc: Tympanic  PainSc: 0-No pain         Complications: No apparent anesthesia complications

## 2019-07-08 NOTE — Anesthesia Postprocedure Evaluation (Signed)
Anesthesia Post Note  Patient: [name] [name]  Procedure(s) Performed: AMPUTATION RAY, PARTIAL RIGHT 1ST (Right Toe)  Patient location during evaluation: PACU Anesthesia Type: General Level of consciousness: awake and alert Pain management: pain level controlled Vital Signs Assessment: post-procedure vital signs reviewed and stable Respiratory status: spontaneous breathing, nonlabored ventilation, respiratory function stable and patient connected to nasal cannula oxygen Cardiovascular status: blood pressure returned to baseline and stable Postop Assessment: no apparent nausea or vomiting Anesthetic complications: no     Last Vitals:  Vitals:   07/08/19 1232 07/08/19 1247  BP: 108/76 (!) 109/50  Pulse: (!) 102 73  Resp: (!) 22 15  Temp:  (!) 36.2 C  SpO2: 94% 98%    Last Pain:  Vitals:   07/08/19 1247  TempSrc:   PainSc: 0-No pain                 Lenard Simmer

## 2019-07-08 NOTE — Consult Note (Signed)
Pharmacy Antibiotic Note  [name] [name] is a 59 y.o. male admitted on 07/06/2019 with necrotic wound of right great toe with underlying osteomyelitis.  MRI shows osteomyelitic changes to distal phalanx and proximal phalanx as well as sever arthritic changes to the first metatarsal phalangeal joint. Plan is for surgical intervention with Dr. Excell Seltzer 2/19(weather permitting). Pharmacy was consulted for Vancomycin dosing. Patient is also on Cefepime. Leukocytosis and renal functionhas slightly improved  Plan: 1) adjust vancomycin dose to 1000 mg IV Q 12 hrs Goal AUC 400-550 Expected AUC: 462 T1/2: 9.7h Expected Css: 26.2/12.0 mcg/mL SCr used: 1.10  2) continue cefepime 2 grams IV every 8 hours  Height: 6' (182.9 cm) Weight: 294 lb (133.4 kg) IBW/kg (Calculated) : 77.6  Temp (24hrs), Avg:98 F (36.7 C), Min:97.7 F (36.5 C), Max:98.2 F (36.8 C)  Recent Labs  Lab 07/06/19 2022 07/07/19 1827 07/08/19 0540  WBC 14.4* 11.6* 12.4*  CREATININE 1.18 1.17 1.10    Estimated Creatinine Clearance: 103.4 mL/min (by C-G formula based on SCr of 1.1 mg/dL).    No Known Allergies  Antimicrobials this admission: Vancomycin 2/16 >>  Cefepime 2/17 >>  Metronidazole 2/17 >>  Microbiology results: 2/17 Wound Cx: abundant GPC, mod GNR, few GVR 2/16 SARS CoV-2: negative 2/16 influenza A/B: negative  Thank you for allowing pharmacy to be a part of this patient's care.  Lowella Bandy 07/08/2019 7:53 AM

## 2019-07-08 NOTE — Evaluation (Signed)
Physical Therapy Evaluation Patient Details Name: [name] [name] MRN: [id number] DOB: [birth year] Today's Date: 07/08/2019   History of Present Illness  59 y.o. male with medical history significant of diabetes, hypertension, hyperlipidemia, morbid obesity, previous cellulitis of the left foot who was sent over from [place] [facility] for right toe infection that has not improved with outpatient therapy.  He is now R great toe partial amputation and L foot wound management 2/18.  Clinical Impression  Pt pleasant and willing to participate with PT.  He reports good understanding of PWBing status (and importance of using walker all the time) and showed good mobility and confidence while doing limited (~35 ft) in-room ambulation.  He has good UE strength and was easily able to manage bed mobility, etc, educated on post-op shoes and how to manage steps.  Pt has a low commode and no grab bars and will need BSC, pt asked about crutches, but PT educated on the need for more stability and ability to maintain PWBing consistently.  Pt in agreement, further PT follow up after d/c per surgeon recommendations.      Follow Up Recommendations Follow surgeon's recommendation for DC plan and follow-up therapies    Equipment Recommendations  Rolling walker with 5" wheels;3in1 (PT)(wide/bariatric walker)    Recommendations for Other Services       Precautions / Restrictions Precautions Precautions: Fall Required Braces or Orthoses: (b/l post-op boots) Restrictions Weight Bearing Restrictions: Yes RLE Weight Bearing: Partial weight bearing LLE Weight Bearing: Partial weight bearing Other Position/Activity Restrictions: post-op shoes and walker      Mobility  Bed Mobility Overal bed mobility: Independent             General bed mobility comments: Pt easily gets to EOB  Transfers Overall transfer level: Independent Equipment used: Rolling walker (2 wheeled)             General transfer comment:  Pt was able to appropriately use UEs to rise and maintain heel-only PWBing   Ambulation/Gait Ambulation/Gait assistance: Supervision Gait Distance (Feet): 35 Feet Assistive device: Rolling walker (2 wheeled)       General Gait Details: Pt was able to maintain heel-only PWBing relatively well with good walker use and understanding of importance of wearing post-op shoes and using walker all the time  Stairs Stairs: (educated verbally, plan to trial tomorrow)          Wheelchair Mobility    Modified Rankin (Stroke Patients Only)       Balance Overall balance assessment: Independent(necessarily reliant on RW but able to maintain balance well)                                           Pertinent Vitals/Pain Pain Assessment: No/denies pain    Home Living Family/patient expects to be discharged to:: Private residence Living Arrangements: Spouse/significant other Available Help at Discharge: Family;Available 24 hours/day   Home Access: Stairs to enter Entrance Stairs-Rails: Right Entrance Stairs-Number of Steps: 2 Home Layout: One level Home Equipment: None      Prior Function Level of Independence: Independent         Comments: Pt installs hardwood floors, carpets, etc - 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     Hand Dominance        Extremity/Trunk Assessment   Upper Extremity Assessment Upper Extremity Assessment: Overall WFL for tasks assessed    Lower Extremity  Assessment Lower Extremity Assessment: Overall WFL for tasks assessed(b/l ankles limited ROM due to bandaging)       Communication   Communication: No difficulties  Cognition Arousal/Alertness: Awake/alert Behavior During Therapy: WFL for tasks assessed/performed Overall Cognitive Status: Within Functional Limits for tasks assessed                                        General Comments      Exercises     Assessment/Plan    PT Assessment Patient needs continued  PT services  PT Problem List Decreased strength;Decreased range of motion;Decreased activity tolerance;Decreased balance;Decreased mobility;Decreased coordination;Decreased knowledge of precautions;Decreased safety awareness;Decreased knowledge of use of DME;Obesity       PT Treatment Interventions DME instruction;Gait training;Stair training;Functional mobility training;Therapeutic activities;Therapeutic exercise;Balance training;Neuromuscular re-education;Patient/family education    PT Goals (Current goals can be found in the Care Plan section)  Acute Rehab PT Goals Patient Stated Goal: go home PT Goal Formulation: With patient Time For Goal Achievement: 07/22/19 Potential to Achieve Goals: Good    Frequency 7X/week   Barriers to discharge        Co-evaluation               AM-PAC PT "6 Clicks" Mobility  Outcome Measure Help needed turning from your back to your side while in a flat bed without using bedrails?: None Help needed moving from lying on your back to sitting on the side of a flat bed without using bedrails?: None Help needed moving to and from a bed to a chair (including a wheelchair)?: None Help needed standing up from a chair using your arms (e.g., wheelchair or bedside chair)?: None Help needed to walk in hospital room?: None Help needed climbing 3-5 steps with a railing? : A Little 6 Click Score: 23    End of Session Equipment Utilized During Treatment: Gait belt Activity Tolerance: Patient tolerated treatment well Patient left: with call bell/phone within reach;in chair Nurse Communication: Mobility status PT Visit Diagnosis: Muscle weakness (generalized) (M62.81);Difficulty in walking, not elsewhere classified (R26.2)    Time: 9476-5465 PT Time Calculation (min) (ACUTE ONLY): 39 min   Charges:   PT Evaluation $PT Eval Low Complexity: 1 Low PT Treatments $Gait Training: 8-22 mins        Malachi Pro, DPT 07/08/2019, 5:28 PM

## 2019-07-08 NOTE — Op Note (Signed)
PODIATRY / FOOT AND ANKLE SURGERY OPERATIVE REPORT    SURGEON: Caroline More, DPM  PRE-OPERATIVE DIAGNOSIS:  1.  Right hallux osteomyelitis 2.  Gas gangrene right hallux with associated cellulitis and abscess secondary to osteomyelitis and wound subfirst IPJ hallux with necrosis of bone 3.  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  Diabetes type 2 polyneuropathy 5.  Left foot plantar medial IPJ hallux and plantar third/fourth metatarsal phalangeal joint ulcerations, subcutaneous level  POST-OPERATIVE DIAGNOSIS: Same  PROCEDURE(S): 1. Partial right hallux amputation  HEMOSTASIS: Right ankle tourniquet  ANESTHESIA: MAC  ESTIMATED BLOOD LOSS: 10 cc  FINDING(S): 1.  Gas gangrene distal aspect right hallux with no proximal tracking past the midshaft of the proximal phalanx hallux 2.  Soft bone at the distal phalanx and head of the proximal phalanx of the right hallux consistent with osteomyelitis 3.  Base of the proximal phalanx of the hallux appeared to be hard in nature with no evidence of osteomyelitis and appeared to have healthy bleeding bone.  PATHOLOGY/SPECIMEN(S): Right hallux pathologic specimen, right hallux soft tissue culture/bone  INDICATIONS:   [name] [name] is a 59 y.o. male who presents with a chronic nonhealing ulceration to the right IPJ hallux plantarly and wounds to his left foot at the IPJ hallux as well as plantar third/fourth metatarsal heads.  Patient was seen by his primary care who noted to that the patient had worsening odor and concerns for infection to the right big toe.  Patient was seen in the ED and an MRI was performed showing osteomyelitis of the right hallux to the level of the head of the proximal phalanx.  A left foot wounds were debrided and appeared to be stable with no signs of infection.  The right hallux wound on the other hand had odor consistent with possible gas gangrene with associated cellulitic changes and the wound  probed to bone.  Discussed all treatment options with the patient both conservative and surgical attempts at correction including potential risks and complications of surgical intervention.  Patient is elected for procedure consisting of right partial first ray amputation.  Discussed with patient that we will do our best to try to save the metatarsal head and base of the hallux to prevent the lesser toes from drifting medially.  All questions answered.  Consent obtained.  DESCRIPTION: After obtaining full informed written consent, the patient was brought back to the operating room and placed supine upon the operating table.  The patient received IV antibiotics prior to induction.  Right ankle tourniquet was applied.  After obtaining adequate anesthesia, 20 cc of half percent Marcaine plain was injected about the right first ray in a Mayo type block.  The patient was prepped and draped in the standard fashion.  An Esmarch bandage was used to exsanguinate the right lower extremity and pneumatic ankle tourniquet was inflated.  Attention was directed to the base of the proximal phalanx of the right hallux where a fishmouth type incision was made straight to bone creating full-thickness flaps dorsally and plantarly.  The incision was made in such a way to remove the ulceration at the plantar aspect of the IPJ as well as all infected/necrotic tissues.  The incision was made again straight to bone and the proximal phalanx was identified at the level of the base.  At this time a sagittal bone saw was used to resect the proximal phalanx at the level of the base with a through and through cut.  The  distal bone was then circumferentially dissected and the hallux was disarticulated at the osteotomy site and passed off the operative site and sent off for pathology.  A culture was also taken from the bone at the IPJ hallux and passed off the operative site and sent off.  The remaining bone present at the base of the proximal  phalanx of the hallux appeared to be healthy overall and appeared to be hard in nature with no purulent drainage able to be expressed, the bone appeared to be healthy and viable and bleeding.  No proximal tracking appeared to be present at the level of the amputation site.  Curved Metzenbaum scissors were used to probe around the area through the extensor and flexor sheath as well as medial foot area but no further purulence was able to be expressed.  The surgical site was flushed with copious amounts normal sterile saline.  The deep structures then reapproximated well coapted along with subcutaneous tissue with 3-0 Vicryl.  The skin was then reapproximated well coapted with combination of simple and horizontal mattress type stitching utilizing 3-0 nylon.  An additional 10 cc of half percent Marcaine plain was injected about the incisional site and operative site.  A postoperative dressing was applied consisting of Xeroform followed by 4 x 4 gauze, ABD, Kerlix, Ace wrap.  A dressing change was also performed to the left side in a similar manner.  The pneumatic ankle tourniquet was deflated and a prompt hyperemic response was noted all digits of the right foot prior to placing the postoperative dressing.  The patient tolerated the procedure and anesthesia well was transferred to recovery room vital signs stable vascular status intact to all toes of the right foot.  Following.  Postoperative monitoring patient be discharged back to the inpatient room the following written oral postop instructions: Keep surgical dressings clean, dry, and intact, ice and elevate right lower extremity, take postoperative pain medications and antibiotics as prescribed, maintain partial weightbearing with heel contact and surgical shoes at all times to both feet and try to stay off the right foot is much possible, x-ray ordered, PT consultation placed.  Will reassess patient again tomorrow and follow-up on culture results.  Likely to  sign off on patient after tomorrow.  COMPLICATIONS: None  CONDITION: Good, stable  Caroline More, D.P.M.

## 2019-07-08 NOTE — Anesthesia Preprocedure Evaluation (Signed)
Anesthesia Evaluation  Patient identified by MRN, date of birth, ID band Patient awake    Reviewed: Allergy & Precautions, H&P , NPO status , Patient's Chart, lab work & pertinent test results, reviewed documented beta blocker date and time   History of Anesthesia Complications Negative for: history of anesthetic complications  Airway Mallampati: III  TM Distance: >3 FB Neck ROM: full    Dental  (+) Dental Advidsory Given, Poor Dentition, Missing   Pulmonary neg pulmonary ROS,    Pulmonary exam normal        Cardiovascular Exercise Tolerance: Good hypertension, (-) angina(-) Past MI and (-) Cardiac Stents Normal cardiovascular exam(-) dysrhythmias (-) Valvular Problems/Murmurs     Neuro/Psych negative neurological ROS  negative psych ROS   GI/Hepatic negative GI ROS, Neg liver ROS,   Endo/Other  diabetes, Type obesity  Renal/GU negative Renal ROS  negative genitourinary   Musculoskeletal   Abdominal   Peds  Hematology negative hematology ROS (+)   Anesthesia Other Findings Past Medical History: No date: Diabetes mellitus without complication (HCC) No date: Hyperlipidemia No date: Hypertension   Reproductive/Obstetrics negative OB ROS                             Anesthesia Physical Anesthesia Plan  ASA: III  Anesthesia Plan: General   Post-op Pain Management:    Induction: Intravenous  PONV Risk Score and Plan: 2 and Ondansetron, Dexamethasone, Midazolam and Treatment may vary due to age or medical condition  Airway Management Planned: LMA  Additional Equipment:   Intra-op Plan:   Post-operative Plan: Extubation in OR  Informed Consent: I have reviewed the patients History and Physical, chart, labs and discussed the procedure including the risks, benefits and alternatives for the proposed anesthesia with the patient or authorized representative who has indicated  his/her understanding and acceptance.     Dental Advisory Given  Plan Discussed with: Anesthesiologist, CRNA and Surgeon  Anesthesia Plan Comments:         Anesthesia Quick Evaluation

## 2019-07-08 NOTE — Anesthesia Procedure Notes (Signed)
Procedure Name: LMA Insertion Date/Time: 07/08/2019 11:26 AM Performed by: Rosanne Gutting, CRNA Pre-anesthesia Checklist: Patient identified, Patient being monitored, Timeout performed, Emergency Drugs available and Suction available Patient Re-evaluated:Patient Re-evaluated prior to induction Oxygen Delivery Method: Circle system utilized Preoxygenation: Pre-oxygenation with 100% oxygen Induction Type: IV induction Ventilation: Mask ventilation without difficulty LMA: LMA inserted LMA Size: 5.0 Tube type: Oral Number of attempts: 1 Placement Confirmation: positive ETCO2 and breath sounds checked- equal and bilateral Tube secured with: Tape Dental Injury: Teeth and Oropharynx as per pre-operative assessment

## 2019-07-08 NOTE — Progress Notes (Signed)
PROGRESS NOTE  [name] [name] GUR:[id number] DOB: 1960/08/18 DOA: 07/06/2019 PCP: Shane Crutch, PA  Brief History   59 year old man PMH including diabetes mellitus presented with right toe infection failing outpatient therapy.  MRI revealed osteomyelitis of the great toe of the distal phalanx and tenosynovitis of toes 1-3.  Admitted for IV antibiotics, infectious disease consultation and surgical management.  Underwent right great toe amputation 2/18.  A & P  Osteomyelitis right great toe, cellulitis right foot.  Secondary to diabetic nonhealing wound.  Failed outpatient antibiotic therapy. --Status post partial right hallux amputation today.  Continue antibiotics per infectious disease.  Diabetes mellitus type 2.  Uncontrolled.  Hemoglobin A1c 8.7.  Holding Metformin while hospitalized. --Continue sliding scale insulin.  Consult diabetes RN coordinator  Hypertension --Stable.  Continue home lisinopril.  DVT prophylaxis: enoxaparin Code Status: Full Family Communication: none Disposition Plan: pending therapy eval    Brendia Sacks, MD  [facility] Direct contact: see [email or web address] (further directions at bottom of note if needed) 7PM-7AM contact night coverage as at bottom of note 07/08/2019, 3:56 PM  LOS: 2 days   Significant Hospital Events   .    Consults:  .    Procedures:  .   Significant Diagnostic Tests:  Marland Kitchen    Micro Data:  .    Antimicrobials:  .   Interval History/Subjective  Feels fine post surgery.  No pain.  Breathing fine.  No vomiting.  Objective   Vitals:  Vitals:   07/08/19 1247 07/08/19 1308  BP: (!) 109/50 122/71  Pulse: 73 91  Resp: 15 16  Temp: (!) 97.2 F (36.2 C) (!) 97.5 F (36.4 C)  SpO2: 98% 100%    Exam:  Constitutional.  Appears calm, comfortable. Respiratory.  Clear to auscultation bilaterally.  No wheezes, rales or rhonchi.  Normal respiratory effort. Cardiovascular.  Regular rate and rhythm.  No murmur, rub or  gallop.  Both feet dressed. Psychiatric.  Grossly normal mood and affect.  Speech fluent and appropriate.  I have personally reviewed the following:   Today's Data  . CBG stable . BMP unremarkable . CBC unremarkable  Scheduled Meds: . aspirin EC  81 mg Oral Daily  . enoxaparin (LOVENOX) injection  40 mg Subcutaneous Q24H  . influenza vac split quadrivalent PF  0.5 mL Intramuscular Tomorrow-1000  . insulin aspart  0-15 Units Subcutaneous TID WC  . insulin aspart  0-5 Units Subcutaneous QHS  . lisinopril  10 mg Oral Daily  . simvastatin  40 mg Oral QHS   Continuous Infusions: . sodium chloride 100 mL/hr at 07/08/19 1324  . sodium chloride 30 mL (07/08/19 1333)  . ceFEPime (MAXIPIME) IV 2 g (07/08/19 1336)  . metronidazole 500 mg (07/08/19 1337)  . vancomycin      Principal Problem:   Cellulitis of great toe of right foot Active Problems:   Hypertension   Hyperlipidemia   Uncontrolled type 2 diabetes mellitus with foot ulcer, without long-term current use of insulin (HCC)   LOS: 2 days   How to contact the [facility] Attending or Consulting provider 7A - 7P or covering provider during after hours 7P -7A, for this patient?  1. Check the care team in [facility] and look for a) attending/consulting [facility] provider listed and b) the [facility] team listed 2. Log into [email or web address] and use [place]'s universal password to access. If you do not have the password, please contact the hospital operator. 3. Locate the [facility] provider you are looking for under [facility]  and page to a number that you can be directly reached. 4. If you still have difficulty reaching the provider, please page the [facility] (Director on Call) for the Hospitalists listed on amion for assistance.

## 2019-07-09 DIAGNOSIS — Z89411 Acquired absence of right great toe: Secondary | ICD-10-CM

## 2019-07-09 LAB — CREATININE, SERUM
Creatinine, Ser: 1.2 mg/dL (ref 0.61–1.24)
GFR calc Af Amer: >60 mL/min (ref >60)
GFR calc non Af Amer: >60 mL/min (ref >60)

## 2019-07-09 LAB — GLUCOSE, CAPILLARY
Glucose-Capillary: 230 mg/dL — ABNORMAL HIGH (ref 70–99)
Glucose-Capillary: 235 mg/dL — ABNORMAL HIGH (ref 70–99)
Glucose-Capillary: 170 mg/dL — ABNORMAL HIGH (ref 70–99)
Glucose-Capillary: 171 mg/dL — ABNORMAL HIGH (ref 70–99)

## 2019-07-09 MED ORDER — SODIUM CHLORIDE 0.9 % IV SOLN
3.0000 g | Freq: Four times a day (QID) | INTRAVENOUS | Status: DC
  Filled 2019-07-09 (×4): qty 8

## 2019-07-09 MED ORDER — INSULIN GLARGINE 100 UNIT/ML ~~LOC~~ SOLN
10.0000 [IU] | Freq: Every day | SUBCUTANEOUS | Status: DC
  Administered 2019-07-09 – 2019-07-10 (×2): 10 [IU] via SUBCUTANEOUS
  Filled 2019-07-09 (×3): qty 0.1

## 2019-07-09 MED ORDER — SODIUM CHLORIDE 0.9 % IV SOLN
2.0000 g | Freq: Three times a day (TID) | INTRAVENOUS | Status: DC
  Administered 2019-07-09 – 2019-07-10 (×3): 2 g via INTRAVENOUS
  Filled 2019-07-09 (×6): qty 2

## 2019-07-09 MED ORDER — METRONIDAZOLE 500 MG PO TABS
500.0000 mg | ORAL_TABLET | Freq: Three times a day (TID) | ORAL | Status: DC
  Administered 2019-07-09 – 2019-07-10 (×3): 500 mg via ORAL
  Filled 2019-07-09 (×4): qty 1

## 2019-07-09 NOTE — Progress Notes (Addendum)
Inpatient Diabetes Program Recommendations  AACE/ADA: New Consensus Statement on Inpatient Glycemic Control (2015)  Target Ranges:  Prepandial:   less than 140 mg/dL      Peak postprandial:   less than 180 mg/dL (1-2 hours)      Critically ill patients:  140 - 180 mg/dL   Lab Results  Component Value Date   GLUCAP 230 (H) 07/09/2019   HGBA1C 8.7 (H) 07/07/2019    Review of Glycemic Control Results for [name] [name], [name] [name] (MRN [id number]) as of 07/09/2019 10:29  Ref. Range 07/08/2019 15:55 07/08/2019 21:26 07/09/2019 07:36  Glucose-Capillary Latest Ref Range: 70 - 99 mg/dL 038 (H) 882 (H) 800 (H)   Diabetes history: DM 2 Outpatient Diabetes medications:  Metformin 1000 mg bid Current orders for Inpatient glycemic control: Novolog moderate tid with meals and HS Inpatient Diabetes Program Recommendations:    A1C=8.7%. Consider adding Lantus 20 units daily while in the hospital.  May need addition of oral agent or possibly insulin due to elevated A1C and to help with wound healing.  Will talk to patient today.    Thanks,  Beryl Meager, RN, BC-ADM Inpatient Diabetes Coordinator Pager [phone] (8a-5p)  Addendum 1500: spoke with patient regarding A1C of 8.7%.  He states that when he was first diagnosed, his A1C was 14% however he was able to get it down with diet and metformin.  Explained current A1C.  He states, I can get it down.  Encouraged him to monitor his blood sugars every morning (goal=80-130 mg/dL).  He also is only eating 2 times a day.  Recommended making sure he eats more frequently but smaller amounts.  Discussed importance of glycemic control for healing and he and wife verbalized understanding.  Encouraged f/u with PCP as well.

## 2019-07-09 NOTE — Progress Notes (Signed)
ID Doing well No fever  No pain  Patient Vitals for the past 24 hrs:  BP Temp Temp src Pulse Resp SpO2  07/09/19 1136 128/76 98.1 F (36.7 C) Oral 77 16 98 %  07/09/19 0516 128/78 (!) 97.3 F (36.3 C) Oral 89 20 96 %  07/08/19 2124 123/74 98.4 F (36.9 C) Oral 94 20 96 %   O/e         CBC Latest Ref Rng & Units 07/08/2019 07/07/2019 07/06/2019  WBC 4.0 - 10.5 K/uL 12.4(H) 11.6(H) 14.4(H)  Hemoglobin 13.0 - 17.0 g/dL 88.8 12.9(L) 12.8(L)  Hematocrit 39.0 - 52.0 % 40.4 38.6(L) 38.1(L)  Platelets 150 - 400 K/uL 333 351 330    CMP Latest Ref Rng & Units 07/09/2019 07/08/2019 07/07/2019  Glucose 70 - 99 mg/dL - 280(K) 349(Z)  BUN 6 - 20 mg/dL - 19 20  Creatinine 7.91 - 1.24 mg/dL 5.05 6.97 9.48  Sodium 135 - 145 mmol/L - 136 132(L)  Potassium 3.5 - 5.1 mmol/L - 4.6 4.5  Chloride 98 - 111 mmol/L - 100 97(L)  CO2 22 - 32 mmol/L - 26 27  Calcium 8.9 - 10.3 mg/dL - 9.0 9.2  Total Protein 6.5 - 8.1 g/dL - - 7.0  Total Bilirubin 0.3 - 1.2 mg/dL - - 0.6  Alkaline Phos 38 - 126 U/L - - 49  AST 15 - 41 U/L - - 14(L)  ALT 0 - 44 U/L - - 12    Impression/recommendation ?Necrotic wound of the rt great toe- S/p amputation Culture pending Currently on cefepime, vanco and flagyl- once culture finalizes will  decide on oral antibiotics for discharge  Diabetes mellitus -management as per primary team  Discussed the management with patient and the care team

## 2019-07-09 NOTE — Progress Notes (Signed)
PODIATRY / FOOT AND ANKLE SURGERY PROGRESS NOTE  Reason for consult: Right foot infection  Chief Complaint: Right foot infection, wounds both feet   HPI: [name] [name] is a 59 y.o. male who presents status post 1 day right partial hallux amputation.  Patient has left surgical dressings clean, dry, and intact to both feet.  Patient has been ambulating with heel contact only in a surgical shoe to both feet as instructed.  Patient denies any pain to the right foot currently but does state that the pain has come and gone since the procedure.  Patient states that he feels like he is doing fairly well overall.  He wonders when he may be able to return home.  PMHx:  Past Medical History:  Diagnosis Date  . Diabetes mellitus without complication (HCC)   . Hyperlipidemia   . Hypertension     Surgical Hx:  Past Surgical History:  Procedure Laterality Date  . AMPUTATION Right 07/08/2019   Procedure: AMPUTATION RAY, PARTIAL RIGHT 1ST;  Surgeon: Rosetta Posner, DPM;  Location: [facility] ORS;  Service: Podiatry;  Laterality: Right;  . NO PAST SURGERIES      FHx:  Family History  Problem Relation Age of Onset  . Hypertension Father     Social History:  reports that he has never smoked. He has never used smokeless tobacco. He reports current alcohol use. He reports that he does not use drugs.  Allergies: No Known Allergies  Review of Systems: General ROS: negative Respiratory ROS: no cough, shortness of breath, or wheezing Cardiovascular ROS: no chest pain or dyspnea on exertion Gastrointestinal ROS: no abdominal pain, change in bowel habits, or black or bloody stools Musculoskeletal ROS: positive for - joint pain and joint swelling Neurological ROS: positive for - numbness/tingling Dermatological ROS: positive for Right hallux amputation incision, left IPJ hallux ulceration, left third metatarsal phalangeal joint ulceration  Medications Prior to Admission  Medication Sig Dispense Refill  .  aspirin 81 MG tablet Take 1 tablet (81 mg total) by mouth daily. 30 tablet 0  . lisinopril (PRINIVIL,ZESTRIL) 10 MG tablet Take 1 tablet (10 mg total) by mouth daily. 90 tablet 3  . metFORMIN (GLUCOPHAGE) 1000 MG tablet Take 1 tablet (1,000 mg total) by mouth 2 (two) times daily with a meal. 180 tablet 3  . simvastatin (ZOCOR) 40 MG tablet Take 1 tablet (40 mg total) by mouth at bedtime. 90 tablet 3  . b complex vitamins tablet Take 1 tablet by mouth daily.      Physical Exam: General: Alert and oriented.  No apparent distress.  Vascular: DP/PT pulses palpable bilateral, capillary fill time intact to all remaining digits and to the flaps of the right hallux amputation site, no hair growth noted.  Decreased erythema and edema noted to the right forefoot and right hallux level since the amputation.  Neuro: Light touch sensation reduced to bilateral lower extremities.  Derm: Amputation site to the right hallux appears to have sutures intact and the incision site appears to be intact with no dehiscence, decreased erythema and edema present since admission to this area, no drainage, no odor.    1.  Left hallux IPJ ulceration appears stable and unchanged since debridement, subcutaneous depth, no erythema or edema present to this area, no odor.  2.  Left third/fourth metatarsal phalangeal joint plantar ulceration appears stable and unchanged since debridement, subcutaneous depth, no erythema or edema or drainage, no odor.  MSK: Right hallux partial amputation  Results for orders placed or  performed during the hospital encounter of 07/06/19 (from the past 48 hour(s))  Aerobic/Anaerobic Culture (surgical/deep wound)     Status: None (Preliminary result)   Collection Time: 07/07/19  1:16 PM   Specimen: Wound  Result Value Ref Range   Specimen Description      WOUND Performed at [facility], [address], [place], [place] [address]    Special Requests      NONE Performed at  [facility], [address] [place], [place] [address]    Gram Stain      NO WBC SEEN ABUNDANT GRAM POSITIVE COCCI MODERATE GRAM NEGATIVE RODS FEW GRAM VARIABLE ROD    Culture      RARE GRAM NEGATIVE RODS CULTURE REINCUBATED FOR BETTER GROWTH HOLDING FOR POSSIBLE ANAEROBE Performed at [facility], [address], [place], [place] [address]    Report Status PENDING   Glucose, capillary     Status: Abnormal   Collection Time: 07/07/19  4:46 PM  Result Value Ref Range   Glucose-Capillary 185 (H) 70 - 99 mg/dL   Comment 1 Notify RN    Comment 2 Document in Chart   Hemoglobin A1c     Status: Abnormal   Collection Time: 07/07/19  6:27 PM  Result Value Ref Range   Hgb A1c MFr Bld 8.7 (H) 4.8 - 5.6 %    Comment: (NOTE) Pre diabetes:          5.7%-6.4% Diabetes:              >6.4% Glycemic control for   <7.0% adults with diabetes    Mean Plasma Glucose 202.99 mg/dL    Comment: Performed at [facility], [address], [place], [place] [address]  HIV Antibody (routine testing w rflx)     Status: None   Collection Time: 07/07/19  6:27 PM  Result Value Ref Range   HIV Screen 4th Generation wRfx NON REACTIVE NON REACTIVE    Comment: Performed at [facility], [address], [place], [place] [address]  CBC     Status: Abnormal   Collection Time: 07/07/19  6:27 PM  Result Value Ref Range   WBC 11.6 (H) 4.0 - 10.5 K/uL   RBC 4.31 4.22 - 5.81 MIL/uL   Hemoglobin 12.9 (L) 13.0 - 17.0 g/dL   HCT 13.2 (L) 44.0 - 10.2 %   MCV 89.6 80.0 - 100.0 fL   MCH 29.9 26.0 - 34.0 pg   MCHC 33.4 30.0 - 36.0 g/dL   RDW 72.5 36.6 - 44.0 %   Platelets 351 150 - 400 K/uL   nRBC 0.0 0.0 - 0.2 %    Comment: Performed at [facility], [address] [place], [place] [address]  Comprehensive metabolic panel     Status: Abnormal   Collection Time: 07/07/19  6:27 PM  Result Value Ref Range   Sodium 132 (L) 135 - 145 mmol/L   Potassium 4.5 3.5 - 5.1 mmol/L    Chloride 97 (L) 98 - 111 mmol/L   CO2 27 22 - 32 mmol/L   Glucose, Bld 252 (H) 70 - 99 mg/dL   BUN 20 6 - 20 mg/dL   Creatinine, Ser 5.95 0.61 - 1.24 mg/dL   Calcium 9.2 8.9 - 63.8 mg/dL   Total Protein 7.0 6.5 - 8.1 g/dL   Albumin 3.3 (L) 3.5 - 5.0 g/dL   AST 14 (L) 15 - 41 U/L   ALT 12 0 -  44 U/L   Alkaline Phosphatase 49 38 - 126 U/L   Total Bilirubin 0.6 0.3 - 1.2 mg/dL   GFR calc non Af Amer >60 >60 mL/min   GFR calc Af Amer >60 >60 mL/min   Anion gap 8 5 - 15    Comment: Performed at [facility], [address] [place], [place] [address]  Glucose, capillary     Status: Abnormal   Collection Time: 07/07/19 11:03 PM  Result Value Ref Range   Glucose-Capillary 178 (H) 70 - 99 mg/dL  CBC     Status: Abnormal   Collection Time: 07/08/19  5:40 AM  Result Value Ref Range   WBC 12.4 (H) 4.0 - 10.5 K/uL   RBC 4.43 4.22 - 5.81 MIL/uL   Hemoglobin 13.3 13.0 - 17.0 g/dL   HCT 25.8 52.7 - 78.2 %   MCV 91.2 80.0 - 100.0 fL   MCH 30.0 26.0 - 34.0 pg   MCHC 32.9 30.0 - 36.0 g/dL   RDW 42.3 53.6 - 14.4 %   Platelets 333 150 - 400 K/uL   nRBC 0.0 0.0 - 0.2 %    Comment: Performed at [facility], [address] [place], [place] [address]  Basic metabolic panel     Status: Abnormal   Collection Time: 07/08/19  5:40 AM  Result Value Ref Range   Sodium 136 135 - 145 mmol/L   Potassium 4.6 3.5 - 5.1 mmol/L   Chloride 100 98 - 111 mmol/L   CO2 26 22 - 32 mmol/L   Glucose, Bld 214 (H) 70 - 99 mg/dL   BUN 19 6 - 20 mg/dL   Creatinine, Ser 0.86 0.61 - 1.24 mg/dL   Calcium 9.0 8.9 - 76.1 mg/dL   GFR calc non Af Amer >60 >60 mL/min   GFR calc Af Amer >60 >60 mL/min   Anion gap 10 5 - 15    Comment: Performed at [facility], [address] [place], [place] [address]  HIV Antibody (routine testing w rflx)     Status: None   Collection Time: 07/08/19  5:40 AM  Result Value Ref Range   HIV Screen 4th Generation wRfx NON REACTIVE NON REACTIVE    Comment:  Performed at [facility], [address], [place], [place] [address]  Glucose, capillary     Status: Abnormal   Collection Time: 07/08/19  7:40 AM  Result Value Ref Range   Glucose-Capillary 189 (H) 70 - 99 mg/dL  Glucose, capillary     Status: Abnormal   Collection Time: 07/08/19 11:02 AM  Result Value Ref Range   Glucose-Capillary 146 (H) 70 - 99 mg/dL  Aerobic/Anaerobic Culture (surgical/deep wound)     Status: None (Preliminary result)   Collection Time: 07/08/19 12:03 PM   Specimen: PATH Other; Tissue  Result Value Ref Range   Specimen Description      WOUND Performed at [facility], [address], [place], [place] [address]    Special Requests      RIGHT BIG TOE Performed at [facility], [address] [place], [place] [address]    Gram Stain      RARE WBC PRESENT, PREDOMINANTLY PMN ABUNDANT GRAM POSITIVE COCCI ABUNDANT GRAM NEGATIVE RODS MODERATE GRAM POSITIVE RODS    Culture      RARE GRAM NEGATIVE RODS CULTURE REINCUBATED FOR BETTER GROWTH Performed at [facility], [address] [place], [place] [address]    Report  Status PENDING   Glucose, capillary     Status: Abnormal   Collection Time: 07/08/19 12:21 PM  Result Value Ref Range   Glucose-Capillary 177 (H) 70 - 99 mg/dL  Glucose, capillary     Status: Abnormal   Collection Time: 07/08/19  3:55 PM  Result Value Ref Range   Glucose-Capillary 298 (H) 70 - 99 mg/dL  Glucose, capillary     Status: Abnormal   Collection Time: 07/08/19  9:26 PM  Result Value Ref Range   Glucose-Capillary 210 (H) 70 - 99 mg/dL  Creatinine, serum     Status: None   Collection Time: 07/09/19  2:28 AM  Result Value Ref Range   Creatinine, Ser 1.20 0.61 - 1.24 mg/dL   GFR calc non Af Amer >60 >60 mL/min   GFR calc Af Amer >60 >60 mL/min    Comment: Performed at [facility], [place] [place],  [address]  Glucose, capillary     Status: Abnormal   Collection Time:  07/09/19  7:36 AM  Result Value Ref Range   Glucose-Capillary 230 (H) 70 - 99 mg/dL  Glucose, capillary     Status: Abnormal   Collection Time: 07/09/19 11:32 AM  Result Value Ref Range   Glucose-Capillary 235 (H) 70 - 99 mg/dL   US ARTERIAL SEG MULTIPLE LE (ABI, SEGMENTAL PRESSURES, PVR'S)  Result Date: 07/08/2019 CLINICAL DATA:  Osteomyelitis, right lower extremity, diabetes, nonhealing vascular ulcers toe amputation scheduled for 07/09/2019 EXAM: NONINVASIVE PHYSIOLOGIC VASCULAR STUDY OF BILATERAL LOWER EXTREMITIES TECHNIQUE: Non-invasive vascular evaluation of both lower extremities was performed at rest, including calculation of ankle-brachial indices, multiple segmental pressure evaluation, segmental Doppler and segmental pulse volume recording. COMPARISON:  None. FINDINGS: Right Lower Extremity Resting ABI:  1.20 Segmental Pressures: Unable to obtain accurate high-thigh segmental pressure because of patient's body habitus. No significant pressure gradient appreciated. Arterial Waveforms: Normal tri-phasic arterial waveforms. PVRs: Not performed Left Lower Extremity: Resting ABI: 1.16 Segmental Pressures: Normal segmental pressures, no significant (20 mmHg) pressure gradient between adjacent segments. Arterial Waveforms: Normal tri-phasic arterial waveforms. PVRs: Not performed Other: Symmetric upper extremity pressures. Ankle Brachial index > 1.4 Non diagnostic secondary to incompressible vessel calcifications 1.0-1.4       Normal 0.9-0.99     Borderline PAD 0.8-0.89     Mild PAD 0.5-0.79     Moderate PAD < 0.5          Severe PAD IMPRESSION: Limited exam but normal triphasic arterial waveforms bilaterally. Normal resting ABIs. Electronically Signed   By: Jerilynn [name].  Shick M.D.   On: 07/08/2019 09:52   DG Foot 2 Views Right  Result Date: 07/08/2019 CLINICAL DATA:  Status post subtotal amputation of the right great toe today. Postoperative exam. EXAM: RIGHT FOOT - 2 VIEW COMPARISON:  Plain films of the  right great toe 07/06/2019. FINDINGS: The great toe has been amputated at the level of the proximal metaphysis of the proximal phalanx. No retained surgical hardware. No acute abnormality. IMPRESSION: Status post great toe amputation as described.  No acute finding. Electronically Signed   By: Inge Rise M.D.   On: 07/08/2019 13:44   DG Foot Complete Left  Result Date: 07/07/2019 CLINICAL DATA:  Left foot ulcers on the great toe and at the plantar aspect of the third MTP joint. EXAM: LEFT FOOT - COMPLETE 3+ VIEW COMPARISON:  Radiographs dated 05/08/2015 FINDINGS: There is destruction of the head of the second metatarsal with some new deformity of the base of the  proximal phalanx of the second toe, possibly due to chronic osteomyelitis. There is also destruction at the IP joint of the great toe involving the base of the distal phalanx and the head of the proximal phalanx. These findings are consistent with osteomyelitis. Minimal arthritic changes of the first MTP joint. New slight bunion formation on the head of the first metatarsal. IMPRESSION: 1. Findings consistent with osteomyelitis of the IP joint of the great toe and of the head of the second metatarsal. Possible chronic osteomyelitis of the base of the proximal phalanx of the second toe. 2. New bunion formation on the head of the first metatarsal. Electronically Signed   By: Francene Boyers M.D.   On: 07/07/2019 13:52    Blood pressure 128/76, pulse 77, temperature 98.1 F (36.7 C), temperature source Oral, resp. rate 16, height 6' (1.829 m), weight 133.4 kg, SpO2 98 %.  Assessment 1. 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 Diabetes type 2 with polyneuropathy  Plan -Patient seen and examined today. -Remove dressing.  Incision site right hallux appears to be intact with sutures intact, no drainage, no odor, decreased erythema and edema present  since amputation.  Redressed with Xeroform followed by 4 x 4 gauze, ABD, Kerlix, Ace wrap.  Patient instructed to leave this dressing clean, dry, and intact. -Left foot ulcerations appear to be stable at this time redressed with Betadine to the wounds followed by 4 x 4 gauze, Kerlix, Ace wrap. -Patient instructed on dressing changes.  Leave right dressing clean, dry, and intact.  Change left foot dressing every 2 to 3 days consisting of Betadine to the wound sites followed by 4 x 4 gauze, Kerlix, Ace wrap. -Patient instructed to continue partial weightbearing with heel contact only to both feet.  Especially to try to stay off the right foot to enhance the chances for healing the amputation site.  Patient understands. -WBC downtrending, afebrile vital signs stable, culture results growing gram-positive cocci, gram-negative rods, gram-positive rods.  Sensitivities pending.  Appreciate recommendations per infectious disease for hopeful oral antibiotics at discharge.  Pathology report pending, believe that all infection was removed with amputation. -Appreciate PT recommendations.  Podiatry team to sign off at this time.  Discharge instructions placed in chart.  If any questions or concerns, please contact.  Rosetta Posner, DPM 07/09/2019, 1:12 PM

## 2019-07-09 NOTE — Progress Notes (Signed)
Physical Therapy Treatment Patient Details Name: [name] [name] MRN: [id number] DOB: [birth year] Today's Date: 07/09/2019    History of Present Illness 59 y.o. male with medical history significant of diabetes, hypertension, hyperlipidemia, morbid obesity, previous cellulitis of the left foot who was sent over from [place] clinic for right toe infection that has not improved with outpatient therapy.  He is now R great toe partial amputation and L foot wound management 2/18.    PT Comments    Pt moving well, good confidence and safety.  He was able to ambulate easily into the hallway and showed good awareness of heel WBing t/o the effort.  Pt was able to negotiate up/down steps with single rail safely and w/o issue, overall he is doing well and feels ready to go home.    Follow Up Recommendations  Follow surgeon's recommendation for DC plan and follow-up therapies(likely will not need PT f/u at d/c)     Equipment Recommendations  Rolling walker with 5" wheels;3in1 (PT)(bariatric/wide RW )    Recommendations for Other Services       Precautions / Restrictions Precautions Precautions: Fall Restrictions RLE Weight Bearing: Partial weight bearing LLE Weight Bearing: Partial weight bearing Other Position/Activity Restrictions: post-op shoes and walker    Mobility  Bed Mobility Overal bed mobility: Independent                Transfers Overall transfer level: Independent Equipment used: Rolling walker (2 wheeled)             General transfer comment: reminders to insure heel WBing only, showed good confidence and safety  Ambulation/Gait Ambulation/Gait assistance: Supervision Gait Distance (Feet): 100 Feet Assistive device: Rolling walker (2 wheeled)       General Gait Details: Pt again able to maintain heel-only PWBing well with good walker use and understanding of importance of wearing post-op shoes and using walker all the time   Stairs Stairs: Yes Stairs  assistance: Modified independent (Device/Increase time) Stair Management: One rail Right Number of Stairs: 3 General stair comments: Pt was able to negotiate steps safely with b/l UEs on rail, no assist needed   Wheelchair Mobility    Modified Rankin (Stroke Patients Only)       Balance                                            Cognition Arousal/Alertness: Awake/alert Behavior During Therapy: WFL for tasks assessed/performed Overall Cognitive Status: Within Functional Limits for tasks assessed                                        Exercises      General Comments General comments (skin integrity, edema, etc.): Pt moving well, confident with mobility.  Cuing from PT mostly to insure he respects precautions and allows feet to heal      Pertinent Vitals/Pain Pain Assessment: No/denies pain    Home Living                      Prior Function            PT Goals (current goals can now be found in the care plan section) Progress towards PT goals: Progressing toward goals    Frequency    Min 2X/week  PT Plan Frequency needs to be updated    Co-evaluation              AM-PAC PT "6 Clicks" Mobility   Outcome Measure  Help needed turning from your back to your side while in a flat bed without using bedrails?: None Help needed moving from lying on your back to sitting on the side of a flat bed without using bedrails?: None Help needed moving to and from a bed to a chair (including a wheelchair)?: None Help needed standing up from a chair using your arms (e.g., wheelchair or bedside chair)?: None Help needed to walk in hospital room?: None Help needed climbing 3-5 steps with a railing? : None 6 Click Score: 24    End of Session Equipment Utilized During Treatment: Gait belt Activity Tolerance: Patient tolerated treatment well Patient left: with call bell/phone within reach;in chair Nurse Communication:  Mobility status PT Visit Diagnosis: Muscle weakness (generalized) (M62.81);Difficulty in walking, not elsewhere classified (R26.2)     Time: 6144-3154 PT Time Calculation (min) (ACUTE ONLY): 14 min  Charges:  $Gait Training: 8-22 mins                     Kreg Shropshire, DPT 07/09/2019, 12:50 PM

## 2019-07-09 NOTE — Progress Notes (Signed)
PROGRESS NOTE  [name] [name] [id number]:[id number] DOB: May 31, 1960 DOA: 07/06/2019 PCP: Ranae Plumber, PA  Brief History   59 year old man PMH including diabetes mellitus presented with right toe infection failing outpatient therapy.  MRI revealed osteomyelitis of the great toe of the distal phalanx and tenosynovitis of toes 1-3.  Admitted for IV antibiotics, infectious disease consultation and surgical management.  Underwent right great toe amputation 2/18.  A & P  Osteomyelitis right great toe, cellulitis right foot.  Secondary to diabetic nonhealing wound.  Failed outpatient antibiotic therapy. --Status post partial right hallux amputation 2/18.  Continue antibiotics per infectious disease.  Diabetes mellitus type 2.  Uncontrolled.  Hemoglobin A1c 8.7.  Holding Metformin while hospitalized. --Hyperglycemic, will add Lantus here.  Continue sliding scale insulin.  Consult diabetes RN coordinator appreciated.  Patient wishes to continue oral therapy at this point.  He reports blood sugars are usually well controlled at home with close A1c suggests otherwise.  Essential hypertension --Remains stable.  Continue home lisinopril.  Disposition Plan: Continue IV antibiotics, follow-up culture data, continue management per infectious disease and podiatry.  Home when cleared by both consultants.  DVT prophylaxis: enoxaparin Code Status: Full Family Communication: none    Murray Hodgkins, MD  [facility] Direct contact: see [email or web address] (further directions at bottom of note if needed) 7PM-7AM contact night coverage as at bottom of note 07/09/2019, 12:16 PM  LOS: 3 days   Significant Hospital Events   .    Consults:  .    Procedures:  .   Significant Diagnostic Tests:  Marland Kitchen    Micro Data:  .    Antimicrobials:  .   Interval History/Subjective  A little bit of pain but otherwise doing well.  Objective   Vitals:  Vitals:   07/09/19 0516 07/09/19 1136  BP: 128/78 128/76  Pulse:  89 77  Resp: 20 16  Temp: (!) 97.3 F (36.3 C) 98.1 F (36.7 C)  SpO2: 96% 98%    Exam:  Constitutional.  Appears calm and comfortable.  Sitting in chair. Respiratory.  Clear to auscultation bilaterally.  No wheezes, rales or rhonchi.  Normal respiratory effort. Cardiovascular.  Regular rate and rhythm.  No murmur, rub or gallop.  1+ lower extremity IMA right greater than left. Psychiatric.  Grossly normal mood and affect.  Speech fluent and appropriate.  I have personally reviewed the following:   Today's Data  . CBG stable  Scheduled Meds: . aspirin EC  81 mg Oral Daily  . enoxaparin (LOVENOX) injection  40 mg Subcutaneous Q24H  . insulin aspart  0-15 Units Subcutaneous TID WC  . insulin aspart  0-5 Units Subcutaneous QHS  . insulin glargine  10 Units Subcutaneous Daily  . lisinopril  10 mg Oral Daily  . simvastatin  40 mg Oral QHS   Continuous Infusions: . sodium chloride Stopped (07/08/19 1626)  . sodium chloride 20 mL/hr at 07/08/19 1700  . ceFEPime (MAXIPIME) IV 2 g (07/09/19 0604)  . metronidazole 500 mg (07/09/19 3007)  . vancomycin 1,000 mg (07/09/19 0254)    Principal Problem:   Cellulitis of great toe of right foot Active Problems:   Hypertension   Hyperlipidemia   Uncontrolled type 2 diabetes mellitus with foot ulcer, without long-term current use of insulin ([place])   LOS: 3 days   How to contact the [facility] Attending or Consulting provider [name] [name] or covering provider during after hours [name] [name], for this patient?  1. Check the care team in [facility] and  look for a) attending/consulting [facility] provider listed and b) the [facility] team listed 2. Log into [email or web address] and use [place]'s universal password to access. If you do not have the password, please contact the hospital operator. 3. Locate the [facility] provider you are looking for under [facility] and page to a number that you can be directly reached. 4. If you still have difficulty reaching the provider, please  page the [facility] (Director on Call) for the Hospitalists listed on amion for assistance.

## 2019-07-09 NOTE — Discharge Instructions (Signed)
Podiatry discharge instructions: 1.  Keep right foot dressing clean, dry, and intact until your first postoperative visit in clinic within 1 week of your discharge date. 2.  Change left foot dressing every other day to every 2 to 3 days consisting of triple antibiotic or Betadine/iodine to the wounds followed by 4 x 4 gauze, gauze roll, and Ace wrap. 3.  Maintain partial weightbearing to both feet with heel contact only and try to stay off of the right foot is much as possible until the incision sites completely healed. 4.  Take antibiotics as prescribed until gone. 5.  Follow-up in clinic within 1 week of your discharge date

## 2019-07-09 NOTE — Consult Note (Signed)
Pharmacy Antibiotic Note  [name] [name] is a 59 y.o. male admitted on 07/06/2019 with necrotic wound of right great toe with underlying osteomyelitis.  MRI shows osteomyelitic changes to distal phalanx and proximal phalanx as well as sever arthritic changes to the first metatarsal phalangeal joint. Plan is for surgical intervention with Dr. Excell Seltzer 2/19(weather permitting). Pharmacy was consulted for Vancomycin and  Cefepime dosing.  ID following  Plan: 1) Continue vancomycin dose 1000 mg IV Q 12 hrs Goal AUC 400-550 Expected AUC: 433 T1/2: 10 Expected Css: 26/12.4 mcg/mL SCr used: 1.20  2)Cefepime 2 gm IV q8h   Height: 6' (182.9 cm) Weight: 294 lb (133.4 kg) IBW/kg (Calculated) : 77.6  Temp (24hrs), Avg:97.9 F (36.6 C), Min:97.3 F (36.3 C), Max:98.4 F (36.9 C)  Recent Labs  Lab 07/06/19 2022 07/07/19 1827 07/08/19 0540 07/09/19 0228  WBC 14.4* 11.6* 12.4*  --   CREATININE 1.18 1.17 1.10 1.20    Estimated Creatinine Clearance: 94.8 mL/min (by C-G formula based on SCr of 1.2 mg/dL).    No Known Allergies  Antimicrobials this admission: Vancomycin 2/16 >>  Cefepime 2/17 >>  Metronidazole 2/17 >>2/19   Microbiology results: 2/17 Wound Cx: abundant GPC, mod GNR, few GVR 2/16 SARS CoV-2: negative 2/16 influenza A/B: negative  Thank you for allowing pharmacy to be a part of this patient's care.  Angelique Blonder, PharmD Clinical Pharmacist 07/09/2019 3:16 PM

## 2019-07-09 NOTE — TOC Initial Note (Signed)
Transition of Care [facility]) - Initial/Assessment Note    Patient Details  Name: [name] [name] MRN: [id number] Date of Birth: Apr 17, 1961  [facility]) CM/SW Contact:    Chapman Fitch, RN Phone Number: 07/09/2019, 2:42 PM  Clinical Narrative:                 Full assessment has not been completed Brad with [employer] has delivered RW and [facility] to the room  Per [facility] ID pharmacist patient will potentially need IV antibiotics at discharge         Patient Goals and CMS Choice        Expected Discharge Plan and Services                           DME Arranged: Dan Humphreys, 3-N-1 DME Agency: [employer] Date DME Agency Contacted: 07/09/19   Representative spoke with at DME Agency: Nida Boatman            Prior Living Arrangements/Services                       Activities of Daily Living Home Assistive Devices/Equipment: None ADL Screening (condition at time of admission) Patient's cognitive ability adequate to safely complete daily activities?: Yes Is the patient deaf or have difficulty hearing?: No Does the patient have difficulty seeing, even when wearing glasses/contacts?: No Does the patient have difficulty concentrating, remembering, or making decisions?: No Patient able to express need for assistance with ADLs?: Yes Does the patient have difficulty dressing or bathing?: No Independently performs ADLs?: Yes (appropriate for developmental age) Does the patient have difficulty walking or climbing stairs?: No Weakness of Legs: None Weakness of Arms/Hands: None  Permission Sought/Granted                  Emotional Assessment              Admission diagnosis:  Cellulitis of great toe of right foot [L03.031] Cellulitis of toe of right foot [L03.031] Diabetic foot infection (HCC) [F16.384, L08.9] Foot ulcer with fat layer exposed, left (HCC) [L97.522] Ulcer of great toe, right, with necrosis of bone (HCC) [Y65.993] Patient Active Problem List    Diagnosis Date Noted  . Cellulitis of great toe of right foot 07/06/2019  . Obesity, Class I, BMI 30-34.9 08/25/2015  . Hyperlipidemia 05/30/2015  . Uncontrolled type 2 diabetes mellitus with foot ulcer, without long-term current use of insulin (HCC) 05/30/2015  . Hypertension 05/26/2015  . Erectile dysfunction 05/26/2015   PCP:  Shane Crutch, PA Pharmacy:   [facility] [address], [place] - [address] [place] [address] Phone: [phone] Fax: [phone]     Social Determinants of Health (SDOH) Interventions    Readmission Risk Interventions No flowsheet data found.

## 2019-07-09 NOTE — Consult Note (Signed)
Pharmacy Antibiotic Note  [name] [name] is a 59 y.o. male admitted on 07/06/2019 with necrotic wound of right great toe with underlying osteomyelitis.  MRI shows osteomyelitic changes to distal phalanx and proximal phalanx as well as sever arthritic changes to the first metatarsal phalangeal joint. Plan is for surgical intervention with Dr. Excell Seltzer 2/19(weather permitting). Pharmacy was consulted for Vancomycin and  Unasyn dosing.  ID following  Plan: 1) Continue vancomycin dose 1000 mg IV Q 12 hrs Goal AUC 400-550 Expected AUC: 433 T1/2: 10 Expected Css: 26/12.4 mcg/mL SCr used: 1.20  2) Unasyn 3 gm IV q6h   Height: 6' (182.9 cm) Weight: 294 lb (133.4 kg) IBW/kg (Calculated) : 77.6  Temp (24hrs), Avg:97.9 F (36.6 C), Min:97.3 F (36.3 C), Max:98.4 F (36.9 C)  Recent Labs  Lab 07/06/19 2022 07/07/19 1827 07/08/19 0540 07/09/19 0228  WBC 14.4* 11.6* 12.4*  --   CREATININE 1.18 1.17 1.10 1.20    Estimated Creatinine Clearance: 94.8 mL/min (by C-G formula based on SCr of 1.2 mg/dL).    No Known Allergies  Antimicrobials this admission: Vancomycin 2/16 >>  Cefepime 2/17 >> 2/19 Metronidazole 2/17 >>2/19 Unasyn 2/19 >>  Microbiology results: 2/17 Wound Cx: abundant GPC, mod GNR, few GVR 2/16 SARS CoV-2: negative 2/16 influenza A/B: negative  Thank you for allowing pharmacy to be a part of this patient's care.  Angelique Blonder, PharmD Clinical Pharmacist 07/09/2019 2:32 PM

## 2019-07-09 NOTE — Consult Note (Addendum)
Pharmacy Antibiotic Note  [name] [name] is a 59 y.o. male admitted on 07/06/2019 with necrotic wound of right great toe with underlying osteomyelitis.  MRI shows osteomyelitic changes to distal phalanx and proximal phalanx as well as sever arthritic changes to the first metatarsal phalangeal joint. Plan is for surgical intervention with Dr. Excell Seltzer 2/19(weather permitting). Pharmacy was consulted for Vancomycin dosing. Patient is also on Cefepime. Leukocytosis and renal functionhas slightly improved  Plan: 1) Continue vancomycin dose 1000 mg IV Q 12 hrs Goal AUC 400-550 Expected AUC: 433 T1/2: 10 Expected Css: 26/12.4 mcg/mL SCr used: 1.20  2) Continue cefepime 2 grams IV every 8 hours  Height: 6' (182.9 cm) Weight: 294 lb (133.4 kg) IBW/kg (Calculated) : 77.6  Temp (24hrs), Avg:97.9 F (36.6 C), Min:97.2 F (36.2 C), Max:99.4 F (37.4 C)  Recent Labs  Lab 07/06/19 2022 07/07/19 1827 07/08/19 0540 07/09/19 0228  WBC 14.4* 11.6* 12.4*  --   CREATININE 1.18 1.17 1.10 1.20    Estimated Creatinine Clearance: 94.8 mL/min (by C-G formula based on SCr of 1.2 mg/dL).    No Known Allergies  Antimicrobials this admission: Vancomycin 2/16 >>  Cefepime 2/17 >>  Metronidazole 2/17 >>  Microbiology results: 2/17 Wound Cx: abundant GPC, mod GNR, few GVR 2/16 SARS CoV-2: negative 2/16 influenza A/B: negative  Thank you for allowing pharmacy to be a part of this patient's care.  Gardner Candle, PharmD, BCPS Clinical Pharmacist 07/09/2019 9:54 AM

## 2019-07-10 DIAGNOSIS — L97514 Non-pressure chronic ulcer of other part of right foot with necrosis of bone: Secondary | ICD-10-CM

## 2019-07-10 LAB — CREATININE, SERUM
Creatinine, Ser: 0.94 mg/dL (ref 0.61–1.24)
GFR calc Af Amer: >60 mL/min (ref >60)
GFR calc non Af Amer: >60 mL/min (ref >60)

## 2019-07-10 LAB — GLUCOSE, CAPILLARY
Glucose-Capillary: 197 mg/dL — ABNORMAL HIGH (ref 70–99)
Glucose-Capillary: 195 mg/dL — ABNORMAL HIGH (ref 70–99)

## 2019-07-10 MED ORDER — AMOXICILLIN-POT CLAVULANATE 875-125 MG PO TABS: 1.0000 | ORAL_TABLET | Freq: Two times a day (BID) | ORAL | 0 refills | Status: AC

## 2019-07-10 MED ORDER — VANCOMYCIN HCL 1250 MG/250ML IV SOLN
1250.0000 mg | Freq: Two times a day (BID) | INTRAVENOUS | Status: DC
  Administered 2019-07-10: 1250 mg via INTRAVENOUS
  Filled 2019-07-10 (×3): qty 250

## 2019-07-10 NOTE — Discharge Summary (Signed)
Physician Discharge Summary  [name] [name] [id number] DOB: April 22, 1961 DOA: 07/06/2019  PCP: Shane Crutch, PA  Admit date: 07/06/2019 Discharge date: 07/10/2019  Recommendations for Outpatient Follow-up:  1. Resolution of diabetic foot infection.  Culture still pending, follow-up final results and adjust antibiotics if needed. 2. Ongoing care for diabetes mellitus type 2 with hyperglycemia.  Consider insulin in the future if indicated.  Follow-up Information    Rosetta Posner, DPM. Schedule an appointment as soon as possible for a visit in 1 week(s).   Specialty: Podiatry Contact information: [address] [place] [address] [phone]            Discharge Diagnoses: Principal diagnosis is #1 1. Necrotic wound right great toe, osteomyelitis right great toe, cellulitis right foot 2. Diabetes mellitus type 2 3. Essential hypertension  Discharge Condition: improved Disposition: home  Diet recommendation: heart healthy, diabetic diet  Filed Weights   07/06/19 1953  Weight: 133.4 kg    History of present illness:  59 year old man PMH including diabetes mellitus presented with right toe infection failing outpatient therapy.  MRI revealed osteomyelitis of the great toe of the distal phalanx and tenosynovitis of toes 1-3.  Admitted for IV antibiotics, infectious disease consultation and surgical management.   Hospital Course:  Based on MRI findings, podiatry recommended amputation.  Patient underwent right great toe amputation 2/18 without significant complication.  Necrotic wound right great toe, osteomyelitis right great toe, cellulitis right foot.  Secondary to diabetic nonhealing wound.  Failed outpatient antibiotic therapy. 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  --Partial weightbearing with heel contact both feet wear surgical shoes --Discussed with infectious disease and Dr. Excell Seltzer today, plan for discharge home on oral Augmentin  for 10 days.  Diabetes mellitus type 2.  Uncontrolled.  Hemoglobin A1c 8.7.  Holding Metformin while hospitalized. --Hyperglycemic, will add Lantus here. Consulted diabetes Statistician appreciated.  Patient wishes to continue oral therapy at this point.  He reports blood sugars are usually well controlled at home with close A1c suggests otherwise.  Essential hypertension --Remains stable.  Continue home lisinopril.   Consults:   Podiatry  Infectious disease  Procedures:   2/18 partial right hallux amputation  Significant Diagnostic Tests:   Normal resting ABIs bilateral lower extremities  MRI showed osteomyelitis great toe  Micro Data:   Preliminary wound culture Proteus pansensitive  Antimicrobials:   Vancomycin 2/16 >>  Cefepime 2/16 >>   Today's assessment: S: Feels fine today.  No issues except IV infiltrated on the right side overnight.  Right arm is improving today.  No significant pain right upper extremity.  No paresthesias. O: Vitals:  Vitals:   07/10/19 0912 07/10/19 1425  BP: 128/83 (!) 155/89  Pulse:  79  Resp:  18  Temp:  98 F (36.7 C)  SpO2:  97%    Constitutional:  . Appears calm and comfortable Respiratory:  . CTA bilaterally, no w/r/r.  . Respiratory effort normal.  Cardiovascular:  . RRR, no m/r/g Psychiatric:  . Mental status o Mood, affect appropriate  CBG stable  Discharge Instructions  Discharge Instructions    Diet - low sodium heart healthy   Complete by: As directed    Diet Carb Modified   Complete by: As directed    Discharge instructions   Complete by: As directed    Call your physician or seek immediate medical attention for swelling, pain, fever or worsening of condition. Partial weightbearing with heel contact both feet wear surgical shoes  Allergies as of 07/10/2019   No Known Allergies     Medication List    TAKE these medications   amoxicillin-clavulanate 875-125 MG tablet Commonly known  as: Augmentin Take 1 tablet by mouth 2 (two) times daily for 10 days.   aspirin 81 MG tablet Take 1 tablet (81 mg total) by mouth daily.   b complex vitamins tablet Take 1 tablet by mouth daily.   lisinopril 10 MG tablet Commonly known as: ZESTRIL Take 1 tablet (10 mg total) by mouth daily.   metFORMIN 1000 MG tablet Commonly known as: GLUCOPHAGE Take 1 tablet (1,000 mg total) by mouth 2 (two) times daily with a meal.   simvastatin 40 MG tablet Commonly known as: ZOCOR Take 1 tablet (40 mg total) by mouth at bedtime.            Durable Medical Equipment  (From admission, onward)         Start     Ordered   07/09/19 0834  For home use only DME 3 n 1  Once     07/09/19 5462   07/09/19 0833  For home use only DME Walker rolling  Once    Comments: Wide/bariatric  Question Answer Comment  Walker: With 5 Inch Wheels   Patient needs a walker to treat with the following condition Toe amputee (HCC)      07/09/19 0833         No Known Allergies  The results of significant diagnostics from this hospitalization (including imaging, microbiology, ancillary and laboratory) are listed below for reference.    Significant Diagnostic Studies: MR FOOT RIGHT W WO CONTRAST  Result Date: 07/07/2019 CLINICAL DATA:  Diabetic ulcer on the medial aspect of the right great toe. Swelling. EXAM: MRI OF THE RIGHT FOREFOOT WITHOUT AND WITH CONTRAST TECHNIQUE: Multiplanar, multisequence MR imaging of the right forefoot was performed before and after the administration of intravenous contrast. CONTRAST:  1mL GADAVIST GADOBUTROL 1 MMOL/ML IV SOLN COMPARISON:  Radiographs dated 07/06/2019 FINDINGS: Bones/Joint/Cartilage There is abnormal edema in the distal phalanx of the great toe with abnormal low signal on T1 imaging and diffuse enhancement of the distal phalanx of the great toe on postcontrast imaging consistent with osteomyelitis. There is severe arthritis of the first MTP joint with less  severe arthritis of the second MTP joint. There are subcortical cysts and erosions of the heads of the first and second metatarsals. Does the patient have a history of gout? Minimal effusions at the first and second MTP joints. Muscles and Tendons Small amount of fluid and enhancement in the flexor hallucis longus tendon sheath and in the flexor tendon a sheath of the second and third toes at the level of the metatarsals. Slight edema and enhancement of the flexor digitorum muscles in the arch of the foot, nonspecific. Soft tissues Soft tissue ulcerations on the plantar aspect of the great toe as well as an ulceration on the medial dorsal aspect of the distal great toe. Both of these extend to the distal phalangeal bone. Edema on the dorsum of the foot, nonspecific. IMPRESSION: 1. Osteomyelitis of the distal phalanx of the great toe with 2 overlying soft tissue ulcers that extend to the bone. 2. 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 3. Tenosynovitis of the flexor tendons of the first, second and third toes. 4. Nonspecific edema and enhancement of the flexor digitorum muscles in the arch of the foot. Electronically Signed   By: Francene Boyers M.D.   On:  07/07/2019 08:08   US ARTERIAL SEG MULTIPLE LE (ABI, SEGMENTAL PRESSURES, PVR'S)  Result Date: 07/08/2019 CLINICAL DATA:  Osteomyelitis, right lower extremity, diabetes, nonhealing vascular ulcers toe amputation scheduled for 07/09/2019 EXAM: NONINVASIVE PHYSIOLOGIC VASCULAR STUDY OF BILATERAL LOWER EXTREMITIES TECHNIQUE: Non-invasive vascular evaluation of both lower extremities was performed at rest, including calculation of ankle-brachial indices, multiple segmental pressure evaluation, segmental Doppler and segmental pulse volume recording. COMPARISON:  None. FINDINGS: Right Lower Extremity Resting ABI:  1.20 Segmental Pressures: Unable to obtain accurate high-thigh segmental pressure because of patient's body habitus. No significant pressure gradient  appreciated. Arterial Waveforms: Normal tri-phasic arterial waveforms. PVRs: Not performed Left Lower Extremity: Resting ABI: 1.16 Segmental Pressures: Normal segmental pressures, no significant (20 mmHg) pressure gradient between adjacent segments. Arterial Waveforms: Normal tri-phasic arterial waveforms. PVRs: Not performed Other: Symmetric upper extremity pressures. Ankle Brachial index > 1.4 Non diagnostic secondary to incompressible vessel calcifications 1.0-1.4       Normal 0.9-0.99     Borderline PAD 0.8-0.89     Mild PAD 0.5-0.79     Moderate PAD < 0.5          Severe PAD IMPRESSION: Limited exam but normal triphasic arterial waveforms bilaterally. Normal resting ABIs. Electronically Signed   By: Judie PetitM.  Shick M.D.   On: 07/08/2019 09:52   DG Foot 2 Views Right  Result Date: 07/08/2019 CLINICAL DATA:  Status post subtotal amputation of the right great toe today. Postoperative exam. EXAM: RIGHT FOOT - 2 VIEW COMPARISON:  Plain films of the right great toe 07/06/2019. FINDINGS: The great toe has been amputated at the level of the proximal metaphysis of the proximal phalanx. No retained surgical hardware. No acute abnormality. IMPRESSION: Status post great toe amputation as described.  No acute finding. Electronically Signed   By: Drusilla Kannerhomas  Dalessio M.D.   On: 07/08/2019 13:44   DG Foot Complete Left  Result Date: 07/07/2019 CLINICAL DATA:  Left foot ulcers on the great toe and at the plantar aspect of the third MTP joint. EXAM: LEFT FOOT - COMPLETE 3+ VIEW COMPARISON:  Radiographs dated 05/08/2015 FINDINGS: There is destruction of the head of the second metatarsal with some new deformity of the base of the proximal phalanx of the second toe, possibly due to chronic osteomyelitis. There is also destruction at the IP joint of the great toe involving the base of the distal phalanx and the head of the proximal phalanx. These findings are consistent with osteomyelitis. Minimal arthritic changes of the first MTP  joint. New slight bunion formation on the head of the first metatarsal. IMPRESSION: 1. Findings consistent with osteomyelitis of the IP joint of the great toe and of the head of the second metatarsal. Possible chronic osteomyelitis of the base of the proximal phalanx of the second toe. 2. New bunion formation on the head of the first metatarsal. Electronically Signed   By: Francene BoyersJames  Maxwell M.D.   On: 07/07/2019 13:52   DG Toe Great Right  Result Date: 07/06/2019 CLINICAL DATA:  59 year old diabetic male with right great toe infection. EXAM: RIGHT GREAT TOE COMPARISON:  None. FINDINGS: Abnormal soft tissue swelling about the right great toe with stranding but no definite soft tissue gas. The great toe phalanges appear intact with no cortical osteolysis identified. Degenerative changes at the 1st MTP and IP joints. No acute osseous abnormality identified. IMPRESSION: Soft tissue swelling with no plain radiographic evidence of osteomyelitis. Electronically Signed   By: Odessa FlemingH  Hall M.D.   On: 07/06/2019 20:41  Microbiology: Recent Results (from the past 240 hour(s))  Respiratory Panel by RT PCR (Flu A&B, Covid) - Nasopharyngeal Swab     Status: None   Collection Time: 07/06/19  9:43 PM   Specimen: Nasopharyngeal Swab  Result Value Ref Range Status   SARS Coronavirus 2 by RT PCR NEGATIVE NEGATIVE Final    Comment: (NOTE) SARS-CoV-2 target nucleic acids are NOT DETECTED. The SARS-CoV-2 RNA is generally detectable in upper respiratoy specimens during the acute phase of infection. The lowest concentration of SARS-CoV-2 viral copies this assay can detect is 131 copies/mL. A negative result does not preclude SARS-Cov-2 infection and should not be used as the sole basis for treatment or other patient management decisions. A negative result may occur with  improper specimen collection/handling, submission of specimen other than nasopharyngeal swab, presence of viral mutation(s) within the areas targeted by  this assay, and inadequate number of viral copies (<131 copies/mL). A negative result must be combined with clinical observations, patient history, and epidemiological information. The expected result is Negative. Fact Sheet for Patients:  PinkCheek.be Fact Sheet for Healthcare Providers:  GravelBags.it This test is not yet ap proved or cleared by the [place] FDA and  has been authorized for detection and/or diagnosis of SARS-CoV-2 by FDA under an Emergency Use Authorization (EUA). This EUA will remain  in effect (meaning this test can be used) for the duration of the COVID-19 declaration under Section 564(b)(1) of the Act, 21 U.S.C. section 360bbb-3(b)(1), unless the authorization is terminated or revoked sooner.    Influenza A by PCR NEGATIVE NEGATIVE Final   Influenza B by PCR NEGATIVE NEGATIVE Final    Comment: (NOTE) The Xpert Xpress SARS-CoV-2/FLU/RSV assay is intended as an aid in  the diagnosis of influenza from Nasopharyngeal swab specimens and  should not be used as a sole basis for treatment. Nasal washings and  aspirates are unacceptable for Xpert Xpress SARS-CoV-2/FLU/RSV  testing. Fact Sheet for Patients: PinkCheek.be Fact Sheet for Healthcare Providers: GravelBags.it This test is not yet approved or cleared by the [place] FDA and  has been authorized for detection and/or diagnosis of SARS-CoV-2 by  FDA under an Emergency Use Authorization (EUA). This EUA will remain  in effect (meaning this test can be used) for the duration of the  Covid-19 declaration under Section 564(b)(1) of the Act, 21  U.S.C. section 360bbb-3(b)(1), unless the authorization is  terminated or revoked. Performed at [facility], [address] [place], [place] [address]   Aerobic/Anaerobic Culture (surgical/deep wound)     Status: None (Preliminary result)     Collection Time: 07/07/19  1:16 PM   Specimen: Wound  Result Value Ref Range Status   Specimen Description   Final    WOUND Performed at [facility], [address], [place], [place] [address]    Special Requests   Final    NONE Performed at [facility], [place],  [address]    Gram Stain   Final    NO WBC SEEN ABUNDANT GRAM POSITIVE COCCI MODERATE GRAM NEGATIVE RODS FEW GRAM VARIABLE ROD Performed at [facility], [place] [address] [place],  [address]    Culture   Final    RARE PROTEUS MIRABILIS CULTURE REINCUBATED FOR BETTER GROWTH HOLDING FOR POSSIBLE ANAEROBE    Report Status PENDING  Incomplete   Organism ID, Bacteria PROTEUS MIRABILIS  Final      Susceptibility   Proteus mirabilis - MIC*  AMPICILLIN <=2 SENSITIVE Sensitive     CEFAZOLIN <=4 SENSITIVE Sensitive     CEFEPIME <=0.12 SENSITIVE Sensitive     CEFTAZIDIME <=1 SENSITIVE Sensitive     CEFTRIAXONE <=0.25 SENSITIVE Sensitive     CIPROFLOXACIN <=0.25 SENSITIVE Sensitive     GENTAMICIN <=1 SENSITIVE Sensitive     IMIPENEM 4 SENSITIVE Sensitive     TRIMETH/SULFA <=20 SENSITIVE Sensitive     AMPICILLIN/SULBACTAM <=2 SENSITIVE Sensitive     PIP/TAZO <=4 SENSITIVE Sensitive     * RARE PROTEUS MIRABILIS  Aerobic/Anaerobic Culture (surgical/deep wound)     Status: None (Preliminary result)   Collection Time: 07/08/19 12:03 PM   Specimen: PATH Other; Tissue  Result Value Ref Range Status   Specimen Description   Final    WOUND Performed at [facility], [address], [place], [place] [address]    Special Requests   Final    RIGHT BIG TOE Performed at [facility], [address] [place], [place] [address]    Gram Stain   Final    RARE WBC PRESENT, PREDOMINANTLY PMN ABUNDANT GRAM POSITIVE COCCI ABUNDANT GRAM NEGATIVE RODS MODERATE GRAM POSITIVE RODS Performed at [facility], [address], [place], [place] [address]     Culture   Final    RARE PROTEUS MIRABILIS CULTURE REINCUBATED FOR BETTER GROWTH    Report Status PENDING  Incomplete   Organism ID, Bacteria PROTEUS MIRABILIS  Final      Susceptibility   Proteus mirabilis - MIC*    AMPICILLIN <=2 SENSITIVE Sensitive     CEFAZOLIN <=4 SENSITIVE Sensitive     CEFEPIME <=0.12 SENSITIVE Sensitive     CEFTAZIDIME <=1 SENSITIVE Sensitive     CEFTRIAXONE <=0.25 SENSITIVE Sensitive     CIPROFLOXACIN <=0.25 SENSITIVE Sensitive     GENTAMICIN <=1 SENSITIVE Sensitive     IMIPENEM 4 SENSITIVE Sensitive     TRIMETH/SULFA <=20 SENSITIVE Sensitive     AMPICILLIN/SULBACTAM <=2 SENSITIVE Sensitive     PIP/TAZO <=4 SENSITIVE Sensitive     * RARE PROTEUS MIRABILIS     Labs: Basic Metabolic Panel: Recent Labs  Lab 07/06/19 2022 07/07/19 1827 07/08/19 0540 07/09/19 0228 07/10/19 0540  NA 134* 132* 136  --   --   K 4.6 4.5 4.6  --   --   CL 98 97* 100  --   --   CO2 29 27 26   --   --   GLUCOSE 159* 252* 214*  --   --   BUN 23* 20 19  --   --   CREATININE 1.18 1.17 1.10 1.20 0.94  CALCIUM 9.2 9.2 9.0  --   --    Liver Function Tests: Recent Labs  Lab 07/06/19 2022 07/07/19 1827  AST 12* 14*  ALT 13 12  ALKPHOS 55 49  BILITOT 0.8 0.6  PROT 7.4 7.0  ALBUMIN 3.8 3.3*   CBC: Recent Labs  Lab 07/06/19 2022 07/07/19 1827 07/08/19 0540  WBC 14.4* 11.6* 12.4*  NEUTROABS 9.3*  --   --   HGB 12.8* 12.9* 13.3  HCT 38.1* 38.6* 40.4  MCV 89.2 89.6 91.2  PLT 330 351 333    CBG: Recent Labs  Lab 07/09/19 1132 07/09/19 1620 07/09/19 2147 07/10/19 0749 07/10/19 1207  GLUCAP 235* 170* 171* 197* 195*    Principal Problem:   Cellulitis of great toe of right foot Active Problems:   Hypertension   Hyperlipidemia   Uncontrolled type 2 diabetes  mellitus with foot ulcer, without long-term current use of insulin (HCC)   Time coordinating discharge: 35 minutes  Signed:  Brendia Sacks, MD  [facility]  07/10/2019, 2:53 PM

## 2019-07-10 NOTE — Progress Notes (Signed)
Patients with c/o discomfort and swelling to right arm near IV site. Site assessed and iv catheter discontinued.  The arm was elevated on a pillow with ice applied to site.  No redness or discoloration noted at this time. Instructed pt. to notify nurse should he have concerns. Will continue to reassess as needed.

## 2019-07-10 NOTE — Consult Note (Signed)
Pharmacy Antibiotic Note  [name] [name] is a 59 y.o. male admitted on 07/06/2019 with necrotic wound of right great toe with underlying osteomyelitis.  MRI shows osteomyelitic changes to distal phalanx and proximal phalanx as well as sever arthritic changes to the first metatarsal phalangeal joint. He  underwent a right great toe amputation on 2/18. Pharmacy was consulted for Vancomycin and  Cefepime dosing. This is day #5 of IV antibiotics, renal function has gradually improved and is now at baseline ID following  Plan: 1) change vancomycin dose to 1250 mg IV Q 12 hrs Goal AUC 400-550 Expected AUC: 456 T1/2: 8.4h Expected Css: 29.9/12.6 mcg/mL SCr used: 0.94  2) continue cefepime 2 gm IV q8h   Height: 6' (182.9 cm) Weight: 294 lb (133.4 kg) IBW/kg (Calculated) : 77.6  Temp (24hrs), Avg:98.1 F (36.7 C), Min:98 F (36.7 C), Max:98.2 F (36.8 C)  Recent Labs  Lab 07/06/19 2022 07/07/19 1827 07/08/19 0540 07/09/19 0228 07/10/19 0540  WBC 14.4* 11.6* 12.4*  --   --   CREATININE 1.18 1.17 1.10 1.20 0.94    Estimated Creatinine Clearance: 121 mL/min (by C-G formula based on SCr of 0.94 mg/dL).    No Known Allergies  Antimicrobials this admission: Vancomycin 2/16 >>  Cefepime 2/17 >>  Metronidazole 2/17 >>  Microbiology results: 2/17 Wound Cx: abundant GPC, P mirabilis, mod GPR 2/16 SARS CoV-2: negative 2/16 influenza A/B: negative  Thank you for allowing pharmacy to be a part of this patient's care.  Lowella Bandy, PharmD Clinical Pharmacist 07/10/2019 10:48 AM

## 2019-07-11 LAB — AEROBIC/ANAEROBIC CULTURE W GRAM STAIN (SURGICAL/DEEP WOUND): Gram Stain: NONE SEEN

## 2019-07-12 LAB — SURGICAL PATHOLOGY

## 2020-08-21 IMAGING — DX DG FOOT 2V*R*
2 series · 2 of 2 positions shown · non-contrast
Comparison: Plain films of the right great toe 07/06/2019.

CLINICAL DATA: Status post subtotal amputation of the right great
toe today. Postoperative exam.

EXAM:
RIGHT FOOT - 2 VIEW

[foot ap]
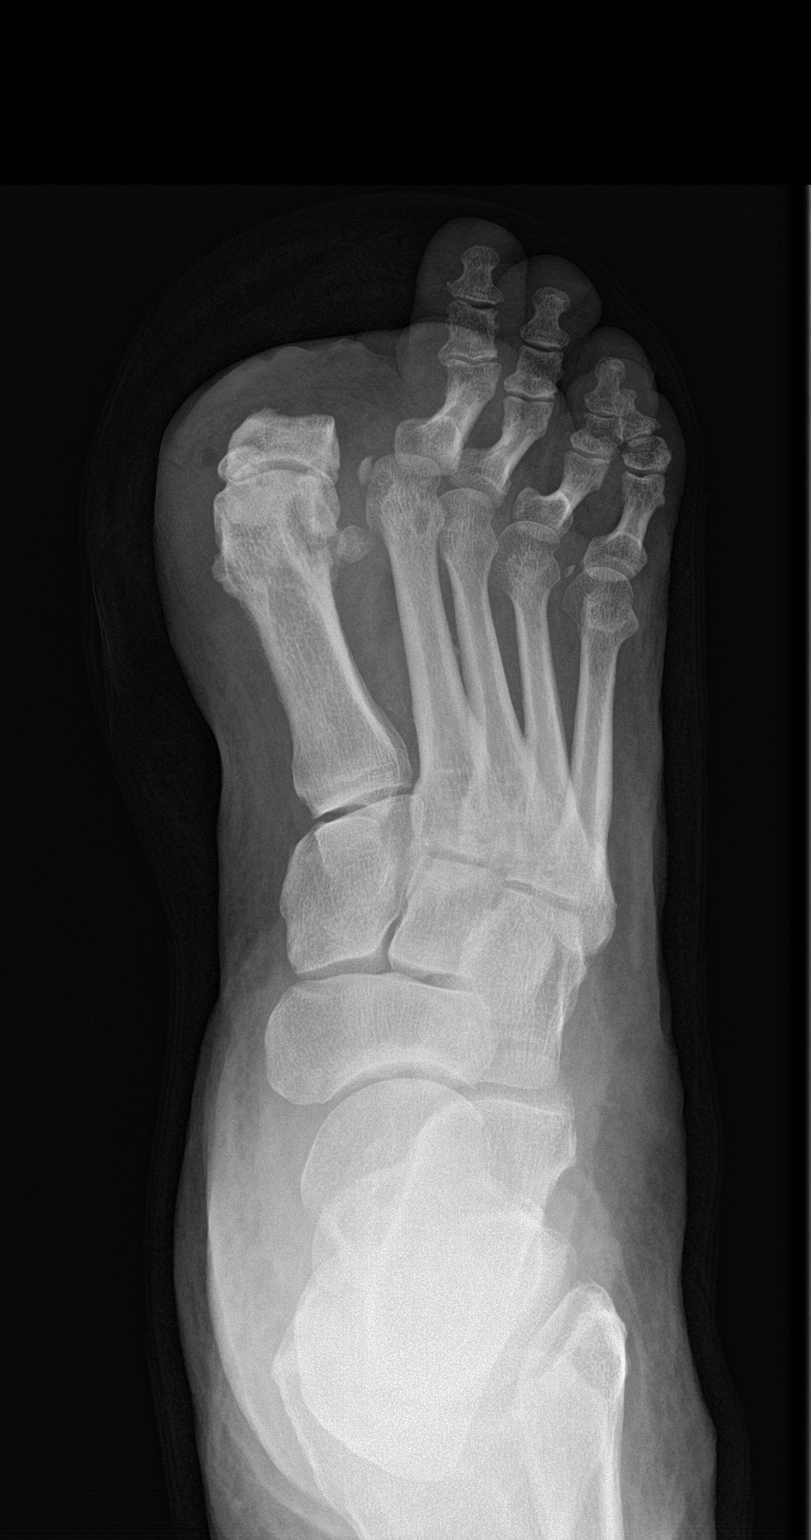

[foot lat]
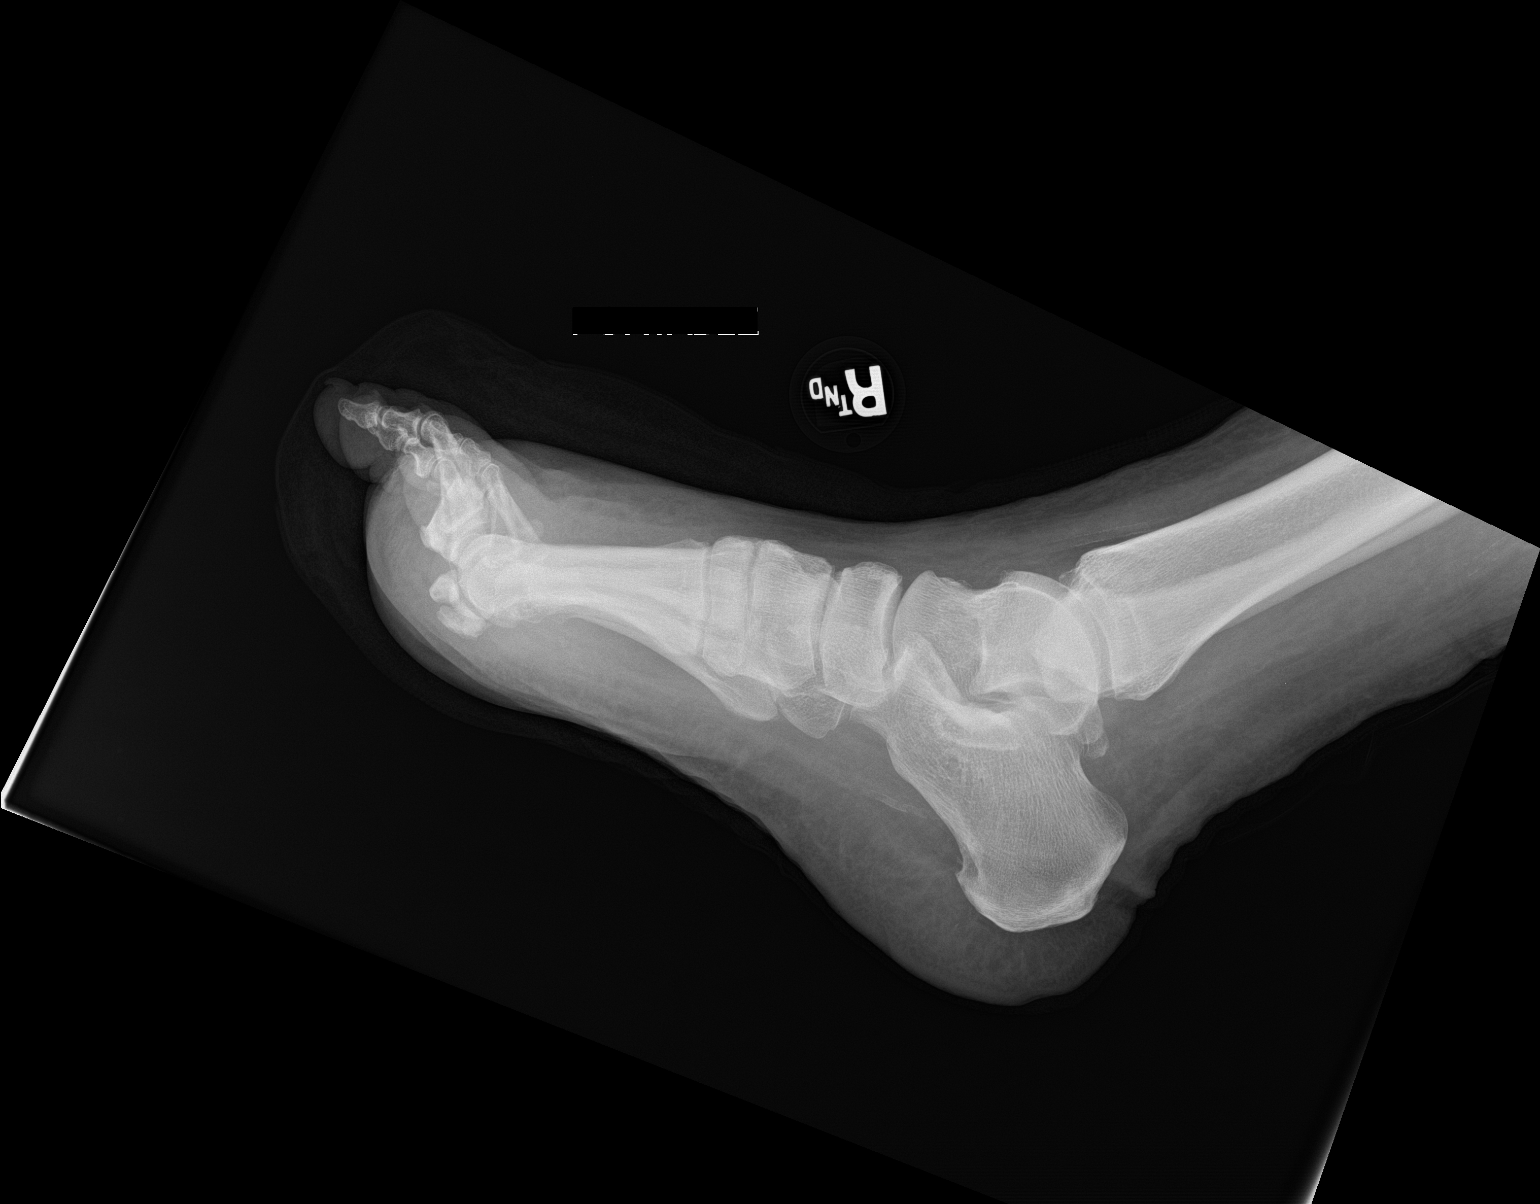

[2 of 2 positions shown; findings below may reference images not displayed]

FINDINGS: The great toe has been amputated at the level of the proximal
metaphysis of the proximal phalanx. No retained surgical hardware.
No acute abnormality.
IMPRESSION: Status post great toe amputation as described.  No acute finding.

## 2023-04-29 ENCOUNTER — Inpatient Hospital Stay
Admission: EM | Admit: 2023-04-29 | Discharge: 2023-05-03 | DRG: 854 | Disposition: A | Discharge status: Home Health | Payer: Self-pay | Attending: Osteopathic Medicine | Admitting: Osteopathic Medicine

## 2023-04-29 ENCOUNTER — Inpatient Hospital Stay: Payer: Self-pay

## 2023-04-29 ENCOUNTER — Emergency Department: Payer: Self-pay

## 2023-04-29 DIAGNOSIS — Z8249 Family history of ischemic heart disease and other diseases of the circulatory system: Secondary | ICD-10-CM

## 2023-04-29 DIAGNOSIS — Z6839 Body mass index (BMI) 39.0-39.9, adult: Secondary | ICD-10-CM

## 2023-04-29 DIAGNOSIS — N529 Male erectile dysfunction, unspecified: Secondary | ICD-10-CM | POA: Diagnosis present

## 2023-04-29 DIAGNOSIS — M86172 Other acute osteomyelitis, left ankle and foot: Secondary | ICD-10-CM | POA: Diagnosis present

## 2023-04-29 DIAGNOSIS — M869 Osteomyelitis, unspecified: Principal | ICD-10-CM | POA: Diagnosis present

## 2023-04-29 DIAGNOSIS — A419 Sepsis, unspecified organism: Principal | ICD-10-CM | POA: Diagnosis present

## 2023-04-29 DIAGNOSIS — L02612 Cutaneous abscess of left foot: Secondary | ICD-10-CM | POA: Diagnosis present

## 2023-04-29 DIAGNOSIS — L98493 Non-pressure chronic ulcer of skin of other sites with necrosis of muscle: Secondary | ICD-10-CM | POA: Diagnosis present

## 2023-04-29 DIAGNOSIS — L97529 Non-pressure chronic ulcer of other part of left foot with unspecified severity: Secondary | ICD-10-CM | POA: Diagnosis present

## 2023-04-29 DIAGNOSIS — E11621 Type 2 diabetes mellitus with foot ulcer: Secondary | ICD-10-CM | POA: Diagnosis present

## 2023-04-29 DIAGNOSIS — E1140 Type 2 diabetes mellitus with diabetic neuropathy, unspecified: Secondary | ICD-10-CM | POA: Diagnosis present

## 2023-04-29 DIAGNOSIS — E119 Type 2 diabetes mellitus without complications: Secondary | ICD-10-CM

## 2023-04-29 DIAGNOSIS — E785 Hyperlipidemia, unspecified: Secondary | ICD-10-CM | POA: Diagnosis present

## 2023-04-29 DIAGNOSIS — M86472 Chronic osteomyelitis with draining sinus, left ankle and foot: Secondary | ICD-10-CM

## 2023-04-29 DIAGNOSIS — Z7982 Long term (current) use of aspirin: Secondary | ICD-10-CM

## 2023-04-29 DIAGNOSIS — R6520 Severe sepsis without septic shock: Secondary | ICD-10-CM

## 2023-04-29 DIAGNOSIS — E1169 Type 2 diabetes mellitus with other specified complication: Secondary | ICD-10-CM | POA: Diagnosis present

## 2023-04-29 DIAGNOSIS — Z89411 Acquired absence of right great toe: Secondary | ICD-10-CM

## 2023-04-29 DIAGNOSIS — E872 Acidosis, unspecified: Secondary | ICD-10-CM | POA: Diagnosis present

## 2023-04-29 DIAGNOSIS — L03032 Cellulitis of left toe: Secondary | ICD-10-CM | POA: Diagnosis present

## 2023-04-29 DIAGNOSIS — Z79899 Other long term (current) drug therapy: Secondary | ICD-10-CM

## 2023-04-29 DIAGNOSIS — Z7984 Long term (current) use of oral hypoglycemic drugs: Secondary | ICD-10-CM

## 2023-04-29 DIAGNOSIS — I10 Essential (primary) hypertension: Secondary | ICD-10-CM | POA: Diagnosis present

## 2023-04-29 DIAGNOSIS — N179 Acute kidney failure, unspecified: Secondary | ICD-10-CM | POA: Diagnosis present

## 2023-04-29 LAB — CBC WITH DIFFERENTIAL/PLATELET
Abs Immature Granulocytes: 0.18 10*3/uL — ABNORMAL HIGH (ref 0.00–0.07)
Basophils Absolute: 0.1 10*3/uL (ref 0.0–0.1)
Basophils Relative: 0 %
Eosinophils Absolute: 0.1 10*3/uL (ref 0.0–0.5)
Eosinophils Relative: 1 %
HCT: 41.3 % (ref 39.0–52.0)
Hemoglobin: 13.6 g/dL (ref 13.0–17.0)
Immature Granulocytes: 1 %
Lymphocytes Relative: 17 %
Lymphs Abs: 3 10*3/uL (ref 0.7–4.0)
MCH: 30.2 pg (ref 26.0–34.0)
MCHC: 32.9 g/dL (ref 30.0–36.0)
MCV: 91.6 fL (ref 80.0–100.0)
Monocytes Absolute: 1.6 10*3/uL — ABNORMAL HIGH (ref 0.1–1.0)
Monocytes Relative: 9 %
Neutro Abs: 12.5 10*3/uL — ABNORMAL HIGH (ref 1.7–7.7)
Neutrophils Relative %: 72 %
Platelets: 420 10*3/uL — ABNORMAL HIGH (ref 150–400)
RBC: 4.51 MIL/uL (ref 4.22–5.81)
RDW: 12.7 % (ref 11.5–15.5)
WBC: 17.5 10*3/uL — ABNORMAL HIGH (ref 4.0–10.5)
nRBC: 0 % (ref 0.0–0.2)

## 2023-04-29 LAB — BASIC METABOLIC PANEL
Anion gap: 12 (ref 5–15)
BUN: 34 mg/dL — ABNORMAL HIGH (ref 8–23)
CO2: 27 mmol/L (ref 22–32)
Calcium: 9.1 mg/dL (ref 8.9–10.3)
Chloride: 96 mmol/L — ABNORMAL LOW (ref 98–111)
Creatinine, Ser: 1.72 mg/dL — ABNORMAL HIGH (ref 0.61–1.24)
GFR, Estimated: 45 mL/min — ABNORMAL LOW (ref >60)
Glucose, Bld: 291 mg/dL — ABNORMAL HIGH (ref 70–99)
Potassium: 3.9 mmol/L (ref 3.5–5.1)
Sodium: 135 mmol/L (ref 135–145)

## 2023-04-29 LAB — CBG MONITORING, ED: Glucose-Capillary: 313 mg/dL — ABNORMAL HIGH (ref 70–99)

## 2023-04-29 LAB — LACTIC ACID, PLASMA: Lactic Acid, Venous: 2.4 mmol/L (ref 0.5–1.9)

## 2023-04-29 MED ORDER — LACTATED RINGERS IV SOLN: 150.0000 mL/h | INTRAVENOUS | Status: DC

## 2023-04-29 MED ORDER — SODIUM CHLORIDE 0.9 % IV BOLUS
1000.0000 mL | Freq: Once | INTRAVENOUS | Status: AC
  Administered 2023-04-29: 1000 mL via INTRAVENOUS

## 2023-04-29 MED ORDER — ACETAMINOPHEN 325 MG PO TABS: 650.0000 mg | ORAL_TABLET | Freq: Four times a day (QID) | ORAL | Status: DC | PRN

## 2023-04-29 MED ORDER — SIMVASTATIN 20 MG PO TABS
40.0000 mg | ORAL_TABLET | Freq: Every day | ORAL | Status: DC
  Administered 2023-04-30 – 2023-05-02 (×3): 40 mg via ORAL
  Filled 2023-04-29: qty 4
  Filled 2023-04-29 (×3): qty 2

## 2023-04-29 MED ORDER — SODIUM CHLORIDE 0.9 % IV SOLN: INTRAVENOUS | Status: DC

## 2023-04-29 MED ORDER — ONDANSETRON HCL 4 MG PO TABS: 4.0000 mg | ORAL_TABLET | Freq: Four times a day (QID) | ORAL | Status: DC | PRN

## 2023-04-29 MED ORDER — ACETAMINOPHEN 650 MG RE SUPP: 650.0000 mg | Freq: Four times a day (QID) | RECTAL | Status: DC | PRN

## 2023-04-29 MED ORDER — SENNOSIDES-DOCUSATE SODIUM 8.6-50 MG PO TABS
1.0000 | ORAL_TABLET | Freq: Every evening | ORAL | Status: AC | PRN
Start: 2023-04-29 — End: ?

## 2023-04-29 MED ORDER — SODIUM CHLORIDE 0.9 % IV SOLN
2.0000 g | Freq: Three times a day (TID) | INTRAVENOUS | Status: DC
  Administered 2023-04-30 – 2023-05-01 (×4): 2 g via INTRAVENOUS
  Filled 2023-04-29 (×5): qty 12.5

## 2023-04-29 MED ORDER — INSULIN ASPART 100 UNIT/ML IJ SOLN
0.0000 [IU] | Freq: Three times a day (TID) | INTRAMUSCULAR | Status: DC
  Administered 2023-04-30: 2 [IU] via SUBCUTANEOUS
  Administered 2023-04-30 (×2): 1 [IU] via SUBCUTANEOUS
  Administered 2023-05-01: 3 [IU] via SUBCUTANEOUS
  Administered 2023-05-01: 1 [IU] via SUBCUTANEOUS
  Administered 2023-05-01: 7 [IU] via SUBCUTANEOUS
  Administered 2023-05-02: 3 [IU] via SUBCUTANEOUS
  Administered 2023-05-02 – 2023-05-03 (×2): 2 [IU] via SUBCUTANEOUS
  Administered 2023-05-03: 3 [IU] via SUBCUTANEOUS
  Filled 2023-04-29 (×9): qty 1

## 2023-04-29 MED ORDER — METRONIDAZOLE 500 MG/100ML IV SOLN
500.0000 mg | Freq: Two times a day (BID) | INTRAVENOUS | Status: DC
  Filled 2023-04-29: qty 100

## 2023-04-29 MED ORDER — LACTATED RINGERS IV SOLN: INTRAVENOUS | Status: DC

## 2023-04-29 MED ORDER — INSULIN ASPART 100 UNIT/ML IJ SOLN
0.0000 [IU] | Freq: Every day | INTRAMUSCULAR | Status: DC
  Administered 2023-04-29: 4 [IU] via SUBCUTANEOUS
  Filled 2023-04-29: qty 1

## 2023-04-29 MED ORDER — HEPARIN SODIUM (PORCINE) 5000 UNIT/ML IJ SOLN
5000.0000 [IU] | Freq: Three times a day (TID) | INTRAMUSCULAR | Status: DC
  Administered 2023-04-29: 5000 [IU] via SUBCUTANEOUS
  Filled 2023-04-29: qty 1

## 2023-04-29 MED ORDER — ONDANSETRON HCL 4 MG/2ML IJ SOLN: 4.0000 mg | Freq: Four times a day (QID) | INTRAMUSCULAR | Status: DC | PRN

## 2023-04-29 MED ORDER — METRONIDAZOLE 500 MG/100ML IV SOLN
500.0000 mg | Freq: Two times a day (BID) | INTRAVENOUS | Status: DC
  Administered 2023-04-29 – 2023-04-30 (×3): 500 mg via INTRAVENOUS
  Filled 2023-04-29 (×3): qty 100

## 2023-04-29 MED ORDER — SODIUM CHLORIDE 0.9 % IV SOLN
2.0000 g | Freq: Once | INTRAVENOUS | Status: AC
  Administered 2023-04-29: 2 g via INTRAVENOUS
  Filled 2023-04-29: qty 12.5

## 2023-04-29 MED ORDER — VANCOMYCIN HCL 1.5 G IV SOLR
1500.0000 mg | INTRAVENOUS | Status: DC
  Administered 2023-05-01: 1500 mg via INTRAVENOUS
  Filled 2023-04-29 (×2): qty 30

## 2023-04-29 MED ORDER — HYDRALAZINE HCL 20 MG/ML IJ SOLN: 5.0000 mg | Freq: Four times a day (QID) | INTRAMUSCULAR | Status: DC | PRN

## 2023-04-29 MED ORDER — VANCOMYCIN HCL 2000 MG/400ML IV SOLN
2000.0000 mg | Freq: Once | INTRAVENOUS | Status: AC
  Administered 2023-04-30: 2000 mg via INTRAVENOUS
  Filled 2023-04-29: qty 400

## 2023-04-29 NOTE — Assessment & Plan Note (Signed)
No recent A1c, last 1 was 8.1% in 2021 Check A1c on admission Insulin SSI with at bedtime coverage ordered Goal inpatient blood glucose levels 140-180 on admission

## 2023-04-29 NOTE — Assessment & Plan Note (Signed)
This meets criteria for morbid obesity based on the presence of 1 or more chronic comorbidities. Patient has NIDDM2, hypertension and BMI of 39.6. This complicates overall care and prognosis.

## 2023-04-29 NOTE — Progress Notes (Signed)
Pharmacy Antibiotic Note  [name] [name] is a 62 y.o. male admitted on 04/29/2023 with  Osteomyelitis .  Pharmacy has been consulted for Cefepime and Vancomycin dosing.  Plan: Vancomycin 2000 mg IV loading dose followed by Vancomycin 1500 mg IV Q 24 hrs. Goal AUC 400-550. Expected AUC: 497.8 SCr used: 1.72 Expected Cmin: 12.2  Cefepime 2g IV q8h   Height: 6' (182.9 cm) Weight: 132.5 kg (292 lb) IBW/kg (Calculated) : 77.6  Temp (24hrs), Avg:97.8 F (36.6 C), Min:97.8 F (36.6 C), Max:97.8 F (36.6 C)  Recent Labs  Lab 04/29/23 1930  WBC 17.5*  CREATININE 1.72*  LATICACIDVEN 2.4*    Estimated Creatinine Clearance: 63.5 mL/min (A) (by C-G formula based on SCr of 1.72 mg/dL (H)).    No Known Allergies  Antimicrobials this admission: Cefepime 12/10 >>  Vancomycin 12/10 >>   Dose adjustments this admission:    Microbiology results:   Thank you for allowing pharmacy to be a part of this patient's care.  Clovia Cuff, PharmD, BCPS 04/29/2023 9:59 PM

## 2023-04-29 NOTE — ED Triage Notes (Signed)
Pt reports he saw PCP a few days ago for left foot infection, pt has hx diabetes. Pt was given 10 day rx of abx that he has not yet completed. Pt reports infection is getting worse. Pt has sloughing skin to bottom of foot and 3rd toe. Pt with redness to foot.

## 2023-04-29 NOTE — H&P (Addendum)
History and Physical   [name] [name] ION:[id number] DOB: [birth year] DOA: 04/29/2023  PCP: Shane Crutch, PA  Patient coming from: Home  I have personally briefly reviewed patient's old medical records in [facility] EMR.  Chief Concern: Left third toe infection  HPI: [name] is a 62 year old male with history of non-insulin-dependent diabetes mellitus, hypertension, hyperlipidemia, morbid obesity, who presents emergency department for chief concerns of left third toe infection, failing outpatient therapy.  Vitals in the ED showed temperature of 97.8, respiration rate 20, heart rate 126, blood pressure 119/74, SpO2 94% on room air.  Serum sodium is 135, potassium 2.9, chloride 96, bicarb 25, BUN of 34, serum creatinine 1.72, EGFR 45, nonfasting blood glucose 17.5, hemoglobin 13.6, platelets of 420.  Lactic acid 2.4.  ED treatment: LR 150 mL/h, cefepime, Flagyl were initiated on admission.  EDP discussed with podiatry, Dr. Ether Griffins who states the patient will be seen. ----------------------------- At bedside, patient patient is able to tell me his name, age, location, current calendar year.  He reports his left foot started draining for approximately 1-1/2 weeks.  He was seen by his outpatient PCP and was prescribed doxycycline 100 mg p.o. twice daily which he started on 04/22/2023.  He reports he has 3 more days left of this medication however it got worse so he decided to come to the emergency department for further evaluation.  He denies fever, chills, nausea, vomiting, chest pain, shortness breath, abdominal pain, dysuria, hematuria, diarrhea.  He denies trauma to his foot.  He reports he noticed his ulcer at the bottom his feet started draining.  He reports that once he noticed that that ulcer starts to drain, he knows that he will need antibiotic or some sort of infection going on.  He reports he has had the foot ulcer on the plantar surface of his left foot for approximately  3 years.  Social history: He is at home with his wife.  He denies tobacco use, states he has only tried it 1 time and did not like it.  He infrequently drinks EtOH, last drink was 2 to 3 weeks ago.  He denies recreational drug use.  He currently works Pharmacist, community.  ROS: Constitutional: no weight change, no fever ENT/Mouth: no sore throat, no rhinorrhea Eyes: no eye pain, no vision changes Cardiovascular: no chest pain, no dyspnea,  no edema, no palpitations Respiratory: no cough, no sputum, no wheezing Gastrointestinal: no nausea, no vomiting, no diarrhea, no constipation Genitourinary: no urinary incontinence, no dysuria, no hematuria Musculoskeletal: no arthralgias, no myalgias Skin: no skin lesions, no pruritus, + left foot ulcer and draining infection Neuro: no weakness, no loss of consciousness, no syncope Psych: no anxiety, no depression, no decrease appetite Heme/Lymph: no bruising, no bleeding  ED Course: Discussed with EDP, patient requiring hospitalization for chief concerns of osteomyelitis of the left third toe.  Assessment/Plan  Principal Problem:   Foot osteomyelitis, left (HCC) Active Problems:   Severe sepsis (HCC)   Diabetes mellitus type 2, noninsulin dependent (HCC)   Hypertension   Erectile dysfunction   Hyperlipidemia   Morbid obesity (HCC)   Assessment and Plan:  * Foot osteomyelitis, left (HCC) ABI ordered on admission Patient may need vascular consultation pending ABI result MRI of the left foot ordered on admission Cefepime, vancomycin, metronidazole  Diabetes mellitus type 2, noninsulin dependent (HCC) No recent A1c, last 1 was 8.1% in 2021 Check A1c on admission Insulin SSI with at bedtime coverage ordered Goal inpatient blood glucose levels  140-180 on admission  Severe sepsis (HCC) Increased respiration rate, leukocytosis, markedly elevated lactic acid with organ involvement of renal Blood cultures x 2 are in process Second lactic  acid is pending collection Continue with cefepime and metronidazole Added vancomycin per pharmacy Ordered sodium chloride 1 L bolus on admission Order sodium chloride infusion at 150 mL/h, 1 day ordered Admit to telemetry medical, inpatient  Morbid obesity (HCC) This meets criteria for morbid obesity based on the presence of 1 or more chronic comorbidities. Patient has NIDDM2, hypertension and BMI of 39.6. This complicates overall care and prognosis.   Hyperlipidemia Simvastatin 40 mg nightly resumed on admission Patient took his Zocor from home in the emergency department.  Hypertension Hydralazine 5 mg IV every 6 hours as needed for SBP greater 175, 5 days ordered  Chart reviewed.   DVT prophylaxis: Heparin 5000 units subcutaneous, one-time dose at 2200 hrs. on admission.  AM team to reinitiate pharmacologic DVT prophylaxis when the benefits outweigh the risk Code Status: Full code Diet: Heart healthy/carb modified; n.p.o. after midnight pending ABI, possible vascular versus podiatry intervention Family Communication: Discussed with spouse at bedside per patient's permission Disposition Plan: Pending clinical course, podiatry evaluation Consults called: Podiatry Admission status: Telemetry medical, inpatient  Past Medical History:  Diagnosis Date   Diabetes mellitus without complication (HCC)    Hyperlipidemia    Hypertension    Past Surgical History:  Procedure Laterality Date   AMPUTATION Right 07/08/2019   Procedure: AMPUTATION RAY, PARTIAL RIGHT 1ST;  Surgeon: Rosetta Posner, DPM;  Location: [facility] ORS;  Service: Podiatry;  Laterality: Right;   NO PAST SURGERIES     Social History:  reports that he has never smoked. He has never used smokeless tobacco. He reports current alcohol use. He reports that he does not use drugs.  No Known Allergies Family History  Problem Relation Age of Onset   Hypertension Father    Family history: Family history reviewed and not  pertinent.  Prior to Admission medications   Medication Sig Start Date End Date Taking? Authorizing Provider  aspirin 81 MG tablet Take 1 tablet (81 mg total) by mouth daily. 05/30/15   Plonk, Chrissie Noa, MD  b complex vitamins tablet Take 1 tablet by mouth daily.    [provider]  lisinopril (PRINIVIL,ZESTRIL) 10 MG tablet Take 1 tablet (10 mg total) by mouth daily. 09/25/15   Plonk, Chrissie Noa, MD  metFORMIN (GLUCOPHAGE) 1000 MG tablet Take 1 tablet (1,000 mg total) by mouth 2 (two) times daily with a meal. 08/28/15   Plonk, Chrissie Noa, MD  simvastatin (ZOCOR) 40 MG tablet Take 1 tablet (40 mg total) by mouth at bedtime. 08/25/15   Schuyler Amor, MD   Physical Exam: Vitals:   04/29/23 1928 04/29/23 1929  BP:  119/74  Pulse:  (!) 126  Resp:  20  Temp:  97.8 F (36.6 C)  TempSrc:  Oral  SpO2:  94%  Weight: 132.5 kg   Height: 6' (1.829 m)    Constitutional: appears age-appropriate, chronically ill, NAD, calm Eyes: PERRL, lids and conjunctivae normal ENMT: Mucous membranes are moist. Posterior pharynx clear of any exudate or lesions. Age-appropriate dentition. Hearing appropriate/ Neck: normal, supple, no masses, no thyromegaly Respiratory: clear to auscultation bilaterally, no wheezing, no crackles. Normal respiratory effort. No accessory muscle use.  Cardiovascular: Regular rate and rhythm, no murmurs / rubs / gallops. No extremity edema. 2+ pedal pulses on the left lower extremity. No carotid bruits.  Abdomen: Morbidly obese abdomen, no tenderness, no  masses palpated, no hepatosplenomegaly. Bowel sounds positive.  Musculoskeletal: no clubbing / cyanosis.  Right first toe amputation. Good ROM, no contractures, no atrophy. Normal muscle tone.  Skin: + left 3rd toe infection, possible bone infection.  Left plantar foot ulcer    Neurologic: Sensation intact. Strength 5/5 in all 4.  Psychiatric: Normal judgment and insight. Alert and oriented x 3. Normal mood.   EKG: Not indicated at  this time  X-ray on Admission: I personally reviewed and I agree with radiologist reading as below.  DG Foot Complete Left  Result Date: 04/29/2023 CLINICAL DATA:  Worsening left foot infection, third digit erythema EXAM: LEFT FOOT - COMPLETE 3+ VIEW COMPARISON:  07/07/2019 FINDINGS: Frontal, oblique, and lateral views of the left foot are obtained. There is diffuse soft tissue edema, greatest in the forefoot and first through third digits. Erosive changes are seen at the base of third proximal phalanx and head of the third metatarsal consistent with acute osteomyelitis. Evidence of chronic osteomyelitis at the first interphalangeal joint and second metatarsophalangeal joints. No acute fractures. Mild midfoot osteoarthritis. IMPRESSION: 1. Diffuse soft tissue edema consistent with cellulitis. Erosive changes at the base of the third proximal phalanx and head of the third metatarsal consistent with acute osteomyelitis. 2. Sequela of chronic osteomyelitis at the first interphalangeal and second metatarsophalangeal joints. 3. Osteoarthritis. Electronically Signed   By: Sharlet Salina M.D.   On: 04/29/2023 20:20    Labs on Admission: I have personally reviewed following labs  CBC: Recent Labs  Lab 04/29/23 1930  WBC 17.5*  NEUTROABS 12.5*  HGB 13.6  HCT 41.3  MCV 91.6  PLT 420*   Basic Metabolic Panel: Recent Labs  Lab 04/29/23 1930  NA 135  K 3.9  CL 96*  CO2 27  GLUCOSE 291*  BUN 34*  CREATININE 1.72*  CALCIUM 9.1   GFR: Estimated Creatinine Clearance: 63.5 mL/min (A) (by C-G formula based on SCr of 1.72 mg/dL (H)).  This document was prepared using Dragon Voice Recognition software and may include unintentional dictation errors.  Dr. Sedalia Muta [facility]  If 7PM-7AM, please contact overnight-coverage provider If 7AM-7PM, please contact day attending provider [email or web address]  04/29/2023, 10:22 PM

## 2023-04-29 NOTE — Hospital Course (Addendum)
HPI: [name] is a 62 year old male with history of non-insulin-dependent diabetes mellitus, hypertension, hyperlipidemia, morbid obesity, who presents emergency department for chief concerns of left third toe infection, failing outpatient therapy. L foot started draining for approximately 1-1/2 weeks. PCP prescribed doxycycline 100 mg p.o. twice daily which he started on 04/22/2023. He reports he has 3 more days left of this Rx however it got worse so he decided to come to the ED  Hospital course / significant events:  12/10: admitted to hospitalist service for L foot osteo, tc broad spectrum abx cefepime, vanc, metronidazole. Podiatry to see in AM.  12/11: podiatry recs for I&D with removal of infected necrotic tissue, and likely will need transmetatarsal amputation - OR planned for Friday 12/13 12/12: stable  12/13: Transmetatarsal amputation left forefoot.  Consultants:  Podiatry   Procedures/Surgeries: 05/02/23 Transmetatarsal amputation left forefoot w/ Dr Ether Griffins      ASSESSMENT & PLAN:   Severe Sepsis d/t Foot osteomyelitis, left Meeting sepsis criteria w/ leukocytosis, elevated HR and RR though high HR was transient, elevated lactic acid, AKI  POD1 s/p Transmetatarsal amputation left forefoot  Plan follow w/ podiatry in office in 1 week (12/20) Cefepime, vancomycin, metronidazole --> Augmentin and Cipro on discharge  Await surgical path/cultures. --> podiatry to follow    Diabetes mellitus type 2, noninsulin dependent (HCC) No recent A1c, last 1 was 8.1% in 2021 Check A1c --> above goal Insulin SSI with at bedtime coverage ordered Goal inpatient blood glucose levels 140-180   AKI - resolved Cr on admission 1.72 (baseline 0.9-1.1) --> 1.03 on 12/13 Monitor BMP    Morbid obesity  This meets criteria for morbid obesity based on the presence of 1 or more chronic comorbidities. Patient has NIDDM2, hypertension and BMI of 39.6.  complicates overall care and  prognosis. Weight management outpatient     Hyperlipidemia Simvastatin 40 mg nightly    Hypertension Hydralazine 5 mg IV every 6 hours as needed for SBP greater 175   Morbid ovesity based on BMI: Body mass index is 39.6 kg/m.  Underweight - under 18.5  normal weight - 18.5 to 24.9 overweight - 25 to 29.9 obese - 30 or more   DVT prophylaxis: lovenox  IV fluids: no continuous IV fluids  Nutrition: diabetic diet, plan for NPO at midnight  Central lines / invasive devices: none  Code Status: FULL CODE ACP documentation reviewed:  none on file in [facility]  TOC needs: TBD Barriers to dispo / significant pending items: pending surgery tomorrow

## 2023-04-29 NOTE — Progress Notes (Signed)
CODE SEPSIS - PHARMACY COMMUNICATION  **Broad Spectrum Antibiotics should be administered within 1 hour of Sepsis diagnosis**  Time Code Sepsis Called/Page Received: 21:18  Antibiotics Ordered: Cefepime and Metronidazole  Time of 1st antibiotic administration: Cefepime given at 21:45  Additional action taken by pharmacy: n/a  If necessary, Name of Provider/Nurse Contacted: n/a    Foye Deer ,PharmD Clinical Pharmacist  04/29/2023  9:51 PM

## 2023-04-29 NOTE — Assessment & Plan Note (Addendum)
Simvastatin 40 mg nightly resumed on admission Patient took his Zocor from home in the emergency department.

## 2023-04-29 NOTE — ED Provider Notes (Signed)
[facility] Provider Note    Event Date/Time   First MD Initiated Contact with Patient 04/29/23 2108     (approximate)   History   Foot Injury   HPI  [name] [name] is a 62 y.o. male with history of diabetes, hypertension who presents with complaints of diabetic foot infection.  Patient reports he has a chronic ulcer to the plantar surface of the left forefoot, recently started to become red and inflamed, will start on antibiotics, seem to be getting better however yesterday overnight worsened significantly now with involvement of the toes.  Afebrile     Physical Exam   Triage Vital Signs: ED Triage Vitals  Encounter Vitals Group     BP 04/29/23 1929 119/74     Systolic BP Percentile --      Diastolic BP Percentile --      Pulse Rate 04/29/23 1929 (!) 126     Resp 04/29/23 1929 20     Temp 04/29/23 1929 97.8 F (36.6 C)     Temp Source 04/29/23 1929 Oral     SpO2 04/29/23 1929 94 %     Weight 04/29/23 1928 132.5 kg (292 lb)     Height 04/29/23 1928 1.829 m (6')     Head Circumference --      Peak Flow --      Pain Score 04/29/23 1928 6     Pain Loc --      Pain Education --      Exclude from Growth Chart --     Most recent vital signs: Vitals:   04/29/23 1929  BP: 119/74  Pulse: (!) 126  Resp: 20  Temp: 97.8 F (36.6 C)  SpO2: 94%     General: Awake, no distress.  CV:  Good peripheral perfusion.  Tachycardic Resp:  Normal effort.  Abd:  No distention.  Other:        ED Results / Procedures / Treatments   Labs (all labs ordered are listed, but only abnormal results are displayed) Labs Reviewed  CBC WITH DIFFERENTIAL/PLATELET - Abnormal; Notable for the following components:      Result Value   WBC 17.5 (*)    Platelets 420 (*)    Neutro Abs 12.5 (*)    Monocytes Absolute 1.6 (*)    Abs Immature Granulocytes 0.18 (*)    All other components within normal limits  BASIC METABOLIC PANEL - Abnormal; Notable for the  following components:   Chloride 96 (*)    Glucose, Bld 291 (*)    BUN 34 (*)    Creatinine, Ser 1.72 (*)    GFR, Estimated 45 (*)    All other components within normal limits  LACTIC ACID, PLASMA - Abnormal; Notable for the following components:   Lactic Acid, Venous 2.4 (*)    All other components within normal limits  CULTURE, BLOOD (ROUTINE X 2)  CULTURE, BLOOD (ROUTINE X 2)  LACTIC ACID, PLASMA     EKG     RADIOLOGY Foot x-ray is consistent with osteomyelitis    PROCEDURES:  Critical Care performed: yes  CRITICAL CARE Performed by: Jene Every   Total critical care time: 30 minutes  Critical care time was exclusive of separately billable procedures and treating other patients.  Critical care was necessary to treat or prevent imminent or life-threatening deterioration.  Critical care was time spent personally by me on the following activities: development of treatment plan with patient and/or surrogate as well as  nursing, discussions with consultants, evaluation of patient's response to treatment, examination of patient, obtaining history from patient or surrogate, ordering and performing treatments and interventions, ordering and review of laboratory studies, ordering and review of radiographic studies, pulse oximetry and re-evaluation of patient's condition.   Procedures   MEDICATIONS ORDERED IN ED: Medications  metroNIDAZOLE (FLAGYL) IVPB 500 mg (has no administration in time range)  lactated ringers infusion (has no administration in time range)  ceFEPIme (MAXIPIME) 2 g in sodium chloride 0.9 % 100 mL IVPB (has no administration in time range)     IMPRESSION / MDM / ASSESSMENT AND PLAN / ED COURSE  I reviewed the triage vital signs and the nursing notes. Patient's presentation is most consistent with acute presentation with potential threat to life or bodily function.  Patient presents with diabetic foot infection, seems to have worsened fairly  rapidly over the last 24 hours.  He is tachycardic but afebrile however his white blood cell count is 17.5 his lactic acid is elevated consistent with sepsis.  Code sepsis activated.  Will treat with broad-spectrum IV antibiotics  X-rays concerning for osteomyelitis  Have consulted Dr. Ether Griffins of podiatry, he will see the patient, will admit to the hospitalist team        FINAL CLINICAL IMPRESSION(S) / ED DIAGNOSES   Final diagnoses:  Osteomyelitis of left foot, unspecified type (HCC)  Sepsis, due to unspecified organism, unspecified whether acute organ dysfunction present [facility])     Rx / DC Orders   ED Discharge Orders     None        Note:  This document was prepared using Dragon voice recognition software and may include unintentional dictation errors.   Jene Every, MD 04/29/23 2138

## 2023-04-29 NOTE — Progress Notes (Signed)
ED Pharmacy Antibiotic Sign Off An antibiotic consult was received from an ED provider for Cefepime per pharmacy dosing for diabetic foot infection. A chart review was completed to assess appropriateness.   The following one time order(s) were placed:  Cefepime 2g IV once  Further antibiotic and/or antibiotic pharmacy consults should be ordered by the admitting provider if indicated.   Thank you for allowing pharmacy to be a part of this patient's care.   Foye Deer, [facility]  Clinical Pharmacist 04/29/23 9:21 PM

## 2023-04-29 NOTE — Assessment & Plan Note (Signed)
Increased respiration rate, leukocytosis, markedly elevated lactic acid with organ involvement of renal Blood cultures x 2 are in process Second lactic acid is pending collection Continue with cefepime and metronidazole Added vancomycin per pharmacy Ordered sodium chloride 1 L bolus on admission Order sodium chloride infusion at 150 mL/h, 1 day ordered Admit to telemetry medical, inpatient

## 2023-04-29 NOTE — Assessment & Plan Note (Addendum)
ABI ordered on admission Patient may need vascular consultation pending ABI result MRI of the left foot ordered on admission Cefepime, vancomycin, metronidazole

## 2023-04-29 NOTE — Assessment & Plan Note (Signed)
Hydralazine 5 mg IV every 6 hours as needed for SBP greater 175, 5 days ordered

## 2023-04-29 NOTE — Sepsis Progress Note (Signed)
Elink monitoring for the code sepsis protocol.  

## 2023-04-30 ENCOUNTER — Inpatient Hospital Stay: Payer: Self-pay

## 2023-04-30 LAB — BASIC METABOLIC PANEL
Anion gap: 12 (ref 5–15)
BUN: 32 mg/dL — ABNORMAL HIGH (ref 8–23)
CO2: 23 mmol/L (ref 22–32)
Calcium: 8.5 mg/dL — ABNORMAL LOW (ref 8.9–10.3)
Chloride: 102 mmol/L (ref 98–111)
Creatinine, Ser: 1.46 mg/dL — ABNORMAL HIGH (ref 0.61–1.24)
GFR, Estimated: 54 mL/min — ABNORMAL LOW (ref >60)
Glucose, Bld: 223 mg/dL — ABNORMAL HIGH (ref 70–99)
Potassium: 3.7 mmol/L (ref 3.5–5.1)
Sodium: 137 mmol/L (ref 135–145)

## 2023-04-30 LAB — HEMOGLOBIN A1C
Hgb A1c MFr Bld: 9.8 % — ABNORMAL HIGH (ref 4.8–5.6)
Mean Plasma Glucose: 234.56 mg/dL

## 2023-04-30 LAB — CBC
HCT: 37.2 % — ABNORMAL LOW (ref 39.0–52.0)
Hemoglobin: 12.1 g/dL — ABNORMAL LOW (ref 13.0–17.0)
MCH: 30.1 pg (ref 26.0–34.0)
MCHC: 32.5 g/dL (ref 30.0–36.0)
MCV: 92.5 fL (ref 80.0–100.0)
Platelets: 329 10*3/uL (ref 150–400)
RBC: 4.02 MIL/uL — ABNORMAL LOW (ref 4.22–5.81)
RDW: 12.8 % (ref 11.5–15.5)
WBC: 12.4 10*3/uL — ABNORMAL HIGH (ref 4.0–10.5)
nRBC: 0 % (ref 0.0–0.2)

## 2023-04-30 LAB — CBG MONITORING, ED
Glucose-Capillary: 181 mg/dL — ABNORMAL HIGH (ref 70–99)
Glucose-Capillary: 136 mg/dL — ABNORMAL HIGH (ref 70–99)
Glucose-Capillary: 131 mg/dL — ABNORMAL HIGH (ref 70–99)

## 2023-04-30 LAB — GLUCOSE, CAPILLARY: Glucose-Capillary: 138 mg/dL — ABNORMAL HIGH (ref 70–99)

## 2023-04-30 LAB — LACTIC ACID, PLASMA: Lactic Acid, Venous: 1.9 mmol/L (ref 0.5–1.9)

## 2023-04-30 MED ORDER — ENOXAPARIN SODIUM 60 MG/0.6ML IJ SOSY
60.0000 mg | PREFILLED_SYRINGE | INTRAMUSCULAR | Status: DC
  Administered 2023-04-30 – 2023-05-02 (×3): 60 mg via SUBCUTANEOUS
  Filled 2023-04-30 (×3): qty 0.6

## 2023-04-30 NOTE — ED Notes (Signed)
Patient in ultrasound; will check blood sugar when he returns.

## 2023-04-30 NOTE — ED Notes (Addendum)
This RN did not collect blood cultures on arrival to room due to being told that "the blood work was done" prior to him coming to assigned room with the assumption that blood cultures were done as well. Sepsis coordinator aware of this delay involving communication. Pt is in nad, and treatment orders have been followed as instructed.

## 2023-04-30 NOTE — ED Notes (Signed)
Pt being transported to rm 159 at this time.

## 2023-04-30 NOTE — Inpatient Diabetes Management (Signed)
Inpatient Diabetes Program Recommendations  AACE/ADA: New Consensus Statement on Inpatient Glycemic Control (2015)  Target Ranges:  Prepandial:   less than 140 mg/dL      Peak postprandial:   less than 180 mg/dL (1-2 hours)      Critically ill patients:  140 - 180 mg/dL   Lab Results  Component Value Date   GLUCAP 181 (H) 04/30/2023   HGBA1C 9.8 (H) 04/29/2023    Review of Glycemic Control  Latest Reference Range & Units 04/29/23 22:02 04/30/23 07:52  Glucose-Capillary 70 - 99 mg/dL 027 (H) 253 (H)  (H): Data is abnormally high  Diabetes history: DM2 Outpatient Diabetes medications:  Jardiance 25 mg every day Victoza 1.8 mg every day Metformin 1000 mg BID Current orders for Inpatient glycemic control:  Novolog 0-9 units TID  Inpatient Diabetes Program Recommendations:    Semglee 15 units every day  Left foot infection, A1C 9.8%, may benefit from insulin at home for better glucose control and wound healing.    Will continue to follow while inpatient.  Thank you, Dulce Sellar, MSN, CDCES Diabetes Coordinator Inpatient Diabetes Program [phone] (team pager from 8a-5p)

## 2023-04-30 NOTE — ED Notes (Signed)
This RN collected blood cultures and lactic at this time.

## 2023-04-30 NOTE — Consult Note (Signed)
Reason for Consult: Osteomyelitis with septic joint left foot Referring Physician: [name] is an 62 y.o. male.  HPI: This is a 62 year old diabetic male who relates a chronic history of an ulceration on the bottom of his left foot.  Recently noticed significant increase in swelling and discoloration and drainage from the foot.  Presented to the emergency department where decision was made for admission with the need for amputation.  Past Medical History:  Diagnosis Date   Diabetes mellitus without complication (HCC)    Hyperlipidemia    Hypertension     Past Surgical History:  Procedure Laterality Date   AMPUTATION Right 07/08/2019   Procedure: AMPUTATION RAY, PARTIAL RIGHT 1ST;  Surgeon: Rosetta Posner, DPM;  Location: [facility] ORS;  Service: Podiatry;  Laterality: Right;   NO PAST SURGERIES      Family History  Problem Relation Age of Onset   Hypertension Father     Social History:  reports that he has never smoked. He has never used smokeless tobacco. He reports current alcohol use. He reports that he does not use drugs.  Allergies: No Known Allergies  Medications: Scheduled:  insulin aspart  0-5 Units Subcutaneous QHS   insulin aspart  0-9 Units Subcutaneous TID WC   simvastatin  40 mg Oral QHS    Results for orders placed or performed during the hospital encounter of 04/29/23 (from the past 48 hour(s))  CBC with Differential     Status: Abnormal   Collection Time: 04/29/23  7:30 PM  Result Value Ref Range   WBC 17.5 (H) 4.0 - 10.5 K/uL   RBC 4.51 4.22 - 5.81 MIL/uL   Hemoglobin 13.6 13.0 - 17.0 g/dL   HCT 57.8 46.9 - 62.9 %   MCV 91.6 80.0 - 100.0 fL   MCH 30.2 26.0 - 34.0 pg   MCHC 32.9 30.0 - 36.0 g/dL   RDW 52.8 41.3 - 24.4 %   Platelets 420 (H) 150 - 400 K/uL   nRBC 0.0 0.0 - 0.2 %   Neutrophils Relative % 72 %   Neutro Abs 12.5 (H) 1.7 - 7.7 K/uL   Lymphocytes Relative 17 %   Lymphs Abs 3.0 0.7 - 4.0 K/uL   Monocytes Relative 9 %   Monocytes  Absolute 1.6 (H) 0.1 - 1.0 K/uL   Eosinophils Relative 1 %   Eosinophils Absolute 0.1 0.0 - 0.5 K/uL   Basophils Relative 0 %   Basophils Absolute 0.1 0.0 - 0.1 K/uL   Immature Granulocytes 1 %   Abs Immature Granulocytes 0.18 (H) 0.00 - 0.07 K/uL    Comment: Performed at [facility], [address] [place], [place] [address]  Basic metabolic panel     Status: Abnormal   Collection Time: 04/29/23  7:30 PM  Result Value Ref Range   Sodium 135 135 - 145 mmol/L   Potassium 3.9 3.5 - 5.1 mmol/L   Chloride 96 (L) 98 - 111 mmol/L   CO2 27 22 - 32 mmol/L   Glucose, Bld 291 (H) 70 - 99 mg/dL    Comment: Glucose reference range applies only to samples taken after fasting for at least 8 hours.   BUN 34 (H) 8 - 23 mg/dL   Creatinine, Ser 2.53 (H) 0.61 - 1.24 mg/dL   Calcium 9.1 8.9 - 66.4 mg/dL   GFR, Estimated 45 (L) >60 mL/min    Comment: (NOTE) Calculated using the CKD-EPI Creatinine Equation (2021)    Anion gap 12 5 -  15    Comment: Performed at [facility], [address] [place], [place] [address]  Lactic acid, plasma     Status: Abnormal   Collection Time: 04/29/23  7:30 PM  Result Value Ref Range   Lactic Acid, Venous 2.4 (HH) 0.5 - 1.9 mmol/L    Comment: CRITICAL RESULT CALLED TO, READ BACK BY AND VERIFIED WITH LISA [facility] 04/29/23 1959 MU Performed at [facility], [address] [place], [place] [address]   Hemoglobin A1c     Status: Abnormal   Collection Time: 04/29/23  7:30 PM  Result Value Ref Range   Hgb A1c MFr Bld 9.8 (H) 4.8 - 5.6 %    Comment: (NOTE) Pre diabetes:          5.7%-6.4%  Diabetes:              >6.4%  Glycemic control for   <7.0% adults with diabetes    Mean Plasma Glucose 234.56 mg/dL    Comment: Performed at [facility], [address] [place], [place] [address]  CBG monitoring, ED     Status: Abnormal   Collection Time: 04/29/23 10:02 PM  Result Value Ref Range   Glucose-Capillary 313 (H) 70 - 99  mg/dL    Comment: Glucose reference range applies only to samples taken after fasting for at least 8 hours.  Lactic acid, plasma     Status: None   Collection Time: 04/30/23 12:28 AM  Result Value Ref Range   Lactic Acid, Venous 1.9 0.5 - 1.9 mmol/L    Comment: Performed at [facility], [address] [place], [place] [address]  Blood Cultures x 2 sites     Status: None (Preliminary result)   Collection Time: 04/30/23 12:28 AM   Specimen: BLOOD LEFT ARM  Result Value Ref Range   Specimen Description BLOOD LEFT ARM    Special Requests      BOTTLES DRAWN AEROBIC AND ANAEROBIC Blood Culture results may not be optimal due to an inadequate volume of blood received in culture bottles   Culture      NO GROWTH < 12 HOURS Performed at [facility], [address] [place], [place] [address]    Report Status PENDING   Blood Cultures x 2 sites     Status: None (Preliminary result)   Collection Time: 04/30/23 12:28 AM   Specimen: BLOOD LEFT ARM  Result Value Ref Range   Specimen Description BLOOD LEFT ARM    Special Requests      BOTTLES DRAWN AEROBIC AND ANAEROBIC Blood Culture results may not be optimal due to an inadequate volume of blood received in culture bottles   Culture      NO GROWTH < 12 HOURS Performed at [facility], [address] [place], [place] [address]    Report Status PENDING   Basic metabolic panel     Status: Abnormal   Collection Time: 04/30/23  7:47 AM  Result Value Ref Range   Sodium 137 135 - 145 mmol/L   Potassium 3.7 3.5 - 5.1 mmol/L   Chloride 102 98 - 111 mmol/L   CO2 23 22 - 32 mmol/L   Glucose, Bld 223 (H) 70 - 99 mg/dL    Comment: Glucose reference range applies only to samples taken after fasting for at least 8 hours.   BUN 32 (H) 8 - 23 mg/dL   Creatinine, Ser 5.78 (H) 0.61 - 1.24 mg/dL   Calcium 8.5 (L)  8.9 - 10.3 mg/dL   GFR, Estimated 54 (L) >60 mL/min    Comment: (NOTE) Calculated using the CKD-EPI Creatinine  Equation (2021)    Anion gap 12 5 - 15    Comment: Performed at [facility], [address] [place], [place] [address]  CBC     Status: Abnormal   Collection Time: 04/30/23  7:47 AM  Result Value Ref Range   WBC 12.4 (H) 4.0 - 10.5 K/uL   RBC 4.02 (L) 4.22 - 5.81 MIL/uL   Hemoglobin 12.1 (L) 13.0 - 17.0 g/dL   HCT 62.9 (L) 52.8 - 41.3 %   MCV 92.5 80.0 - 100.0 fL   MCH 30.1 26.0 - 34.0 pg   MCHC 32.5 30.0 - 36.0 g/dL   RDW 24.4 01.0 - 27.2 %   Platelets 329 150 - 400 K/uL   nRBC 0.0 0.0 - 0.2 %    Comment: Performed at [facility], [address] [place], [place] [address]  CBG monitoring, ED     Status: Abnormal   Collection Time: 04/30/23  7:52 AM  Result Value Ref Range   Glucose-Capillary 181 (H) 70 - 99 mg/dL    Comment: Glucose reference range applies only to samples taken after fasting for at least 8 hours.  CBG monitoring, ED     Status: Abnormal   Collection Time: 04/30/23 11:55 AM  Result Value Ref Range   Glucose-Capillary 136 (H) 70 - 99 mg/dL    Comment: Glucose reference range applies only to samples taken after fasting for at least 8 hours.    US ARTERIAL ABI (SCREENING LOWER EXTREMITY)  Result Date: 04/30/2023 CLINICAL DATA:  Osteomyelitis of the left foot Hypertension Hyperlipidemia Diabetes EXAM: NONINVASIVE PHYSIOLOGIC VASCULAR STUDY OF BILATERAL LOWER EXTREMITIES TECHNIQUE: Evaluation of both lower extremities were performed at rest, including calculation of ankle-brachial indices with single level pressure measurements and doppler recording. COMPARISON:  None available. FINDINGS: Right ABI:  1.38 Left ABI:  1.29 Right Lower Extremity:  Normal arterial waveforms at the ankle. Left Lower Extremity:  Normal arterial waveforms at the ankle. 1.0-1.4 Normal IMPRESSION: Normal ankle-brachial indices. Electronically Signed   By: Acquanetta Belling M.D.   On: 04/30/2023 11:57   MR FOOT LEFT WO CONTRAST  Result Date: 04/30/2023 CLINICAL DATA:   Diabetic foot infection EXAM: MRI OF THE LEFT FOOT WITHOUT CONTRAST TECHNIQUE: Multiplanar, multisequence MR imaging of the left forefoot was performed. No intravenous contrast was administered. COMPARISON:  X-ray 04/29/2023 FINDINGS: Bones/Joint/Cartilage Findings of acute osteomyelitis involving the proximal, middle, and distal phalanx of the third toe as well as the third metatarsal head and diaphysis. Intense bone marrow edema within these bones with confluent low T1 marrow signal changes. Large complex third MTP joint effusion is compatible with septic arthritis. Milder bone marrow edema within the fourth toe proximal phalanx, fourth metatarsal head as well as within the bones of the second toe, which may represent reactive osteitis or early osteomyelitis. The third MTP joint is dorsally dislocated. The great toe IP joint is in extension. No fractures. Degenerative changes are most pronounced at the second MTP joint. Ligaments Intact Lisfranc ligament.  No acute collateral ligament injury. Muscles and Tendons Denervation changes of the foot musculature. The third toe flexor tendon is ill-defined at the level of the soft tissue ulceration and may be torn. Mild tenosynovitis. Soft tissues Soft tissue ulceration at the plantar aspect of the forefoot underlying the third MTP joint. Large complex fluid collection at the plantar  aspect of the third toe and extending between the second and third toes measuring 3.6 x 2.6 x 3.3 cm. Prominent subcutaneous edema throughout the dorsum of the foot. IMPRESSION: 1. Soft tissue ulceration at the plantar aspect of the forefoot underlying the third MTP joint. Large complex fluid collection at the plantar aspect of the third toe and extending between the second and third toes measuring 3.6 x 2.6 x 3.3 cm, compatible with abscess. 2. Findings of acute osteomyelitis involving the proximal, middle, and distal phalanx of the third toe as well as the third metatarsal head and  diaphysis. Large complex third MTP joint effusion is compatible with septic arthritis. The third MTP joint is dorsally dislocated. 3. Milder bone marrow edema within the fourth toe proximal phalanx, fourth metatarsal head as well as within the bones of the second toe, which may represent reactive osteitis or early osteomyelitis. 4. The third toe flexor tendon is ill-defined at the level of the soft tissue ulceration and may be torn. Mild tenosynovitis. Electronically Signed   By: Duanne Guess D.O.   On: 04/30/2023 08:28   DG Foot Complete Left  Result Date: 04/29/2023 CLINICAL DATA:  Worsening left foot infection, third digit erythema EXAM: LEFT FOOT - COMPLETE 3+ VIEW COMPARISON:  07/07/2019 FINDINGS: Frontal, oblique, and lateral views of the left foot are obtained. There is diffuse soft tissue edema, greatest in the forefoot and first through third digits. Erosive changes are seen at the base of third proximal phalanx and head of the third metatarsal consistent with acute osteomyelitis. Evidence of chronic osteomyelitis at the first interphalangeal joint and second metatarsophalangeal joints. No acute fractures. Mild midfoot osteoarthritis. IMPRESSION: 1. Diffuse soft tissue edema consistent with cellulitis. Erosive changes at the base of the third proximal phalanx and head of the third metatarsal consistent with acute osteomyelitis. 2. Sequela of chronic osteomyelitis at the first interphalangeal and second metatarsophalangeal joints. 3. Osteoarthritis. Electronically Signed   By: Sharlet Salina M.D.   On: 04/29/2023 20:20    Review of Systems  Constitutional:  Negative for chills and fever.  HENT:  Negative for sinus pain and sore throat.   Respiratory:  Negative for cough and shortness of breath.   Cardiovascular:  Negative for chest pain and palpitations.  Gastrointestinal:  Negative for nausea and vomiting.  Genitourinary:  Negative for frequency and urgency.  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  Skin:        Patient relates a chronic sore on his left foot present for quite some time.  Recently had increased redness and swelling and drainage.  Neurological:        Patient does have numbness in the feet associated with his diabetes.  Psychiatric/Behavioral:  Negative for confusion. The patient is not nervous/anxious.    Blood pressure 111/61, pulse 81, temperature 97.9 F (36.6 C), temperature source Oral, resp. rate (!) 23, height 6' (1.829 m), weight 132.5 kg, SpO2 95%. Physical Exam Cardiovascular:     Comments: DP pulse is thready and difficult to palpate on the left.  1/4 on the right.  PT pulse could not be palpated on the left and 1/4 on the right. Musculoskeletal:     Comments: Adequate range of motion of the pedal joints.  Prior amputation of the right great toe.  Skin:    Comments: Significant edema noted in the left foot with diminished hair growth.  Focal edema and erythema with ulceration  around the entire left third toe with postdebridement measurement at least 3 cm x 2 cm.  Full-thickness ulceration beneath the left third metatarsal measuring approximately 12 mm diameter pre and postdebridement with central necrosis extending down to the level of bone.  Neurological:     Comments: Loss of sensation distally in the forefoot and toes.          Assessment/Plan: Assessment: 1.  Osteomyelitis with septic joint left third ray. 2.  Full-thickness ulceration with necrosis of muscle down to the level of bone. 3.  Cellulitis with abscess left third toe. 4.  Diabetes with associated neuropathy.  Plan: Excisional debridement of the ulcerations on the left foot sharply using tissue nippers including the epidermal and dermal layers as well as deeper full-thickness necrotic tissue beneath the third metatarsal.  Betadine gauze dressing applied to the foot.  Patient just got finished with having his ABIs performed.   Pending results may need vascular consult.  Discussed with the patient that with the extent of the infection I do not necessarily think that a ray resection would have much chance of success and may require a transmetatarsal amputation.  Dr. Ether Griffins who is on-call will be assuming care.  Discussed with his attending that angio if needed would not be performed till tomorrow so we can remove his n.p.o. order.  [name] [name] 04/30/2023, 1:00 PM

## 2023-04-30 NOTE — Progress Notes (Signed)
Follow-up left foot.  Patient was seen earlier today by Dr. Alberteen Spindle.  Patient was undergoing ABIs recently.  These have resulted.  MRI has resulted as well.  MRI results: IMPRESSION: 1. Soft tissue ulceration at the plantar aspect of the forefoot underlying the third MTP joint. Large complex fluid collection at the plantar aspect of the third toe and extending between the second and third toes measuring 3.6 x 2.6 x 3.3 cm, compatible with abscess. 2. Findings of acute osteomyelitis involving the proximal, middle, and distal phalanx of the third toe as well as the third metatarsal head and diaphysis. Large complex third MTP joint effusion is compatible with septic arthritis. The third MTP joint is dorsally dislocated. 3. Milder bone marrow edema within the fourth toe proximal phalanx, fourth metatarsal head as well as within the bones of the second toe, which may represent reactive osteitis or early osteomyelitis. 4. The third toe flexor tendon is ill-defined at the level of the soft tissue ulceration and may be torn. Mild tenosynovitis.  ABI results: IMPRESSION: Limited exam but normal triphasic arterial waveforms bilaterally. Normal resting ABIs.  Patient with noted large ulceration with abscess around the forefoot region.  MRI is consistent with osteomyelitis of the third and metatarsophalangeal joint into the shaft of the metatarsal region.  Large joint effusion and fluid abscess around second and third toe joint regions.  Bare marrow edema around the fourth toe joint as well.  Given the large amount of extensive infection salvage of the forefoot is unlikely.  I discussed this with the patient today.  Patient will need I&D with removal of infected necrotic tissue.  Will likely need transmetatarsal amputation.  To perform this coming Friday.  We discussed this may have to be a staged procedure given the amount of infectious process and possible need for further I&D debridement if reinfection  occurs.  Will follow-up tomorrow for final discussion of surgery and consent.

## 2023-04-30 NOTE — Progress Notes (Signed)
PROGRESS NOTE    [name] [name]   ZOX:[id number] DOB: Jul 13, 1960  DOA: 04/29/2023 Date of Service: 04/30/23 which is hospital day 1  PCP: Shane Crutch, PA    HPI: [name] [name] is a 62 year old male with history of non-insulin-dependent diabetes mellitus, hypertension, hyperlipidemia, morbid obesity, who presents emergency department for chief concerns of left third toe infection, failing outpatient therapy. L foot started draining for approximately 1-1/2 weeks. PCP prescribed doxycycline 100 mg p.o. twice daily which he started on 04/22/2023. He reports he has 3 more days left of this Rx however it got worse so he decided to come to the ED  Hospital course / significant events:  12/10: admitted to hospitalist service for L foot osteo, tc broad spectrum abx cefepime, vanc, metronidazole. Podiatry to see in AM.  12/11: podiatry to plan for amputation Friday 12/13  Consultants:  Podiatry   Procedures/Surgeries: None       ASSESSMENT & PLAN:   Severe Sepsis d/t Foot osteomyelitis, left Meeting sepsis criteria w/ leukocytosis, elevated HR and RR though high HR was transient, elevated lactic acid, AKI  Podiatry consult - plan for amputation Fri 12/13  Cefepime, vancomycin, metronidazole Follow blood cultures    Diabetes mellitus type 2, noninsulin dependent (HCC) No recent A1c, last 1 was 8.1% in 2021 Check A1c --> above goal Insulin SSI with at bedtime coverage ordered Goal inpatient blood glucose levels 140-180   AKI  Cr on admission 1.72 (baseline 0.9-1.1)  Monitor BMP s/p IV fluids    Morbid obesity  This meets criteria for morbid obesity based on the presence of 1 or more chronic comorbidities. Patient has NIDDM2, hypertension and BMI of 39.6.  complicates overall care and prognosis. Weight management outpatient     Hyperlipidemia Simvastatin 40 mg nightly resumed on admission Of note, patient took his Zocor from home in the emergency department.    Hypertension Hydralazine 5 mg IV every 6 hours as needed for SBP greater 175   Morbid ovesity based on BMI: Body mass index is 39.6 kg/m.  Underweight - under 18.5  normal weight - 18.5 to 24.9 overweight - 25 to 29.9 obese - 30 or more   DVT prophylaxis: lovenox  IV fluids: no continuous IV fluids  Nutrition: diabetic diet  Central lines / invasive devices: none  Code Status: FULL CODE ACP documentation reviewed:  none on file in [facility]  TOC needs: TBD Barriers to dispo / significant pending items: pending surgery in 2 days              Subjective / Brief ROS:  Patient reports no concerns Denies CP/SOB.  Pain controlled.  Denies new weakness.  Tolerating diet.  Reports no concerns w/ urination/defecation.   Family Communication: family at bedside on rounds     Objective Findings:  Vitals:   04/30/23 1430 04/30/23 1432 04/30/23 1500 04/30/23 1515  BP: 130/76  121/70   Pulse: 89  84   Resp: (!) 21  (!) 21   Temp:  97.8 F (36.6 C) 97.6 F (36.4 C)   TempSrc:  Oral Oral   SpO2: 99%  96% 97%  Weight:      Height:        Intake/Output Summary (Last 24 hours) at 04/30/2023 1646 Last data filed at 04/30/2023 1346 Gross per 24 hour  Intake 1390 ml  Output --  Net 1390 ml   Filed Weights   04/29/23 1928  Weight: 132.5 kg    Examination:  Physical Exam  Constitutional:      Appearance: Normal appearance. He is obese.  Cardiovascular:     Rate and Rhythm: Normal rate and regular rhythm.  Pulmonary:     Effort: Pulmonary effort is normal.     Breath sounds: Normal breath sounds.  Abdominal:     Palpations: Abdomen is soft.  Skin:    General: Skin is warm and dry.  Neurological:     General: No focal deficit present.     Mental Status: He is alert and oriented to person, place, and time.  Psychiatric:        Mood and Affect: Mood normal.        Behavior: Behavior normal.          Scheduled Medications:   enoxaparin (LOVENOX)  injection  40 mg Subcutaneous Q24H   insulin aspart  0-5 Units Subcutaneous QHS   insulin aspart  0-9 Units Subcutaneous TID WC   simvastatin  40 mg Oral QHS    Continuous Infusions:  ceFEPime (MAXIPIME) IV Stopped (04/30/23 1346)   metronidazole Stopped (04/30/23 1044)   vancomycin      PRN Medications:  acetaminophen **OR** acetaminophen, hydrALAZINE, ondansetron **OR** ondansetron (ZOFRAN) IV, senna-docusate  Antimicrobials from admission:  Anti-infectives (From admission, onward)    Start     Dose/Rate Route Frequency Ordered Stop   04/30/23 2200  Vancomycin (VANCOCIN) 1,500 mg in sodium chloride 0.9 % 500 mL IVPB        1,500 mg 250 mL/hr over 120 Minutes Intravenous Every 24 hours 04/29/23 2156     04/30/23 0600  ceFEPIme (MAXIPIME) 2 g in sodium chloride 0.9 % 100 mL IVPB        2 g 200 mL/hr over 30 Minutes Intravenous Every 8 hours 04/29/23 2156     04/29/23 2200  metroNIDAZOLE (FLAGYL) IVPB 500 mg       Note to Pharmacy: His left toe is terrible looking   500 mg 100 mL/hr over 60 Minutes Intravenous Every 12 hours 04/29/23 2145 05/06/23 2144   04/29/23 2200  vancomycin (VANCOREADY) IVPB 2000 mg/400 mL        2,000 mg 200 mL/hr over 120 Minutes Intravenous  Once 04/29/23 2156 04/30/23 0301   04/29/23 2130  metroNIDAZOLE (FLAGYL) IVPB 500 mg  Status:  Discontinued        500 mg 100 mL/hr over 60 Minutes Intravenous Every 12 hours 04/29/23 2117 04/29/23 2145   04/29/23 2130  ceFEPIme (MAXIPIME) 2 g in sodium chloride 0.9 % 100 mL IVPB        2 g 200 mL/hr over 30 Minutes Intravenous  Once 04/29/23 2120 04/29/23 2203           Data Reviewed:  I have personally reviewed the following...  CBC: Recent Labs  Lab 04/29/23 1930 04/30/23 0747  WBC 17.5* 12.4*  NEUTROABS 12.5*  --   HGB 13.6 12.1*  HCT 41.3 37.2*  MCV 91.6 92.5  PLT 420* 329   Basic Metabolic Panel: Recent Labs  Lab 04/29/23 1930 04/30/23 0747  NA 135 137  K 3.9 3.7  CL 96* 102  CO2  27 23  GLUCOSE 291* 223*  BUN 34* 32*  CREATININE 1.72* 1.46*  CALCIUM 9.1 8.5*   GFR: Estimated Creatinine Clearance: 74.9 mL/min (A) (by C-G formula based on SCr of 1.46 mg/dL (H)). Liver Function Tests: No results for input(s): "AST", "ALT", "ALKPHOS", "BILITOT", "PROT", "ALBUMIN" in the last 168 hours. No results for input(s): "LIPASE", "AMYLASE" in the  last 168 hours. No results for input(s): "AMMONIA" in the last 168 hours. Coagulation Profile: No results for input(s): "INR", "PROTIME" in the last 168 hours. Cardiac Enzymes: No results for input(s): "CKTOTAL", "CKMB", "CKMBINDEX", "TROPONINI" in the last 168 hours. BNP (last 3 results) No results for input(s): "PROBNP" in the last 8760 hours. HbA1C: Recent Labs    04/29/23 1930  HGBA1C 9.8*   CBG: Recent Labs  Lab 04/29/23 2202 04/30/23 0752 04/30/23 1155 04/30/23 1638  GLUCAP 313* 181* 136* 131*   Lipid Profile: No results for input(s): "CHOL", "HDL", "LDLCALC", "TRIG", "CHOLHDL", "LDLDIRECT" in the last 72 hours. Thyroid Function Tests: No results for input(s): "TSH", "T4TOTAL", "FREET4", "T3FREE", "THYROIDAB" in the last 72 hours. Anemia Panel: No results for input(s): "VITAMINB12", "FOLATE", "FERRITIN", "TIBC", "IRON", "RETICCTPCT" in the last 72 hours. Most Recent Urinalysis On File:  No results found for: "COLORURINE", "APPEARANCEUR", "LABSPEC", "PHURINE", "GLUCOSEU", "HGBUR", "BILIRUBINUR", "KETONESUR", "PROTEINUR", "UROBILINOGEN", "NITRITE", "LEUKOCYTESUR" Sepsis Labs: @LABRCNTIP (procalcitonin:4,lacticidven:4) Microbiology: Recent Results (from the past 240 hour(s))  Blood Cultures x 2 sites     Status: None (Preliminary result)   Collection Time: 04/30/23 12:28 AM   Specimen: BLOOD LEFT ARM  Result Value Ref Range Status   Specimen Description BLOOD LEFT ARM  Final   Special Requests   Final    BOTTLES DRAWN AEROBIC AND ANAEROBIC Blood Culture results may not be optimal due to an inadequate volume of  blood received in culture bottles   Culture   Final    NO GROWTH < 12 HOURS Performed at [facility], [address], [place], [place] [address]    Report Status PENDING  Incomplete  Blood Cultures x 2 sites     Status: None (Preliminary result)   Collection Time: 04/30/23 12:28 AM   Specimen: BLOOD LEFT ARM  Result Value Ref Range Status   Specimen Description BLOOD LEFT ARM  Final   Special Requests   Final    BOTTLES DRAWN AEROBIC AND ANAEROBIC Blood Culture results may not be optimal due to an inadequate volume of blood received in culture bottles   Culture   Final    NO GROWTH < 12 HOURS Performed at [facility], [address], [place], [place] [address]    Report Status PENDING  Incomplete      Radiology Studies last 3 days: US ARTERIAL ABI (SCREENING LOWER EXTREMITY)  Result Date: 04/30/2023 CLINICAL DATA:  Osteomyelitis of the left foot Hypertension Hyperlipidemia Diabetes EXAM: NONINVASIVE PHYSIOLOGIC VASCULAR STUDY OF BILATERAL LOWER EXTREMITIES TECHNIQUE: Evaluation of both lower extremities were performed at rest, including calculation of ankle-brachial indices with single level pressure measurements and doppler recording. COMPARISON:  None available. FINDINGS: Right ABI:  1.38 Left ABI:  1.29 Right Lower Extremity:  Normal arterial waveforms at the ankle. Left Lower Extremity:  Normal arterial waveforms at the ankle. 1.0-1.4 Normal IMPRESSION: Normal ankle-brachial indices. Electronically Signed   By: Acquanetta Belling M.D.   On: 04/30/2023 11:57   MR FOOT LEFT WO CONTRAST  Result Date: 04/30/2023 CLINICAL DATA:  Diabetic foot infection EXAM: MRI OF THE LEFT FOOT WITHOUT CONTRAST TECHNIQUE: Multiplanar, multisequence MR imaging of the left forefoot was performed. No intravenous contrast was administered. COMPARISON:  X-ray 04/29/2023 FINDINGS: Bones/Joint/Cartilage Findings of acute osteomyelitis involving the proximal, middle, and distal phalanx of the  third toe as well as the third metatarsal head and diaphysis. Intense bone marrow edema within these bones with confluent low T1 marrow signal changes. Large complex third MTP joint effusion  is compatible with septic arthritis. Milder bone marrow edema within the fourth toe proximal phalanx, fourth metatarsal head as well as within the bones of the second toe, which may represent reactive osteitis or early osteomyelitis. The third MTP joint is dorsally dislocated. The great toe IP joint is in extension. No fractures. Degenerative changes are most pronounced at the second MTP joint. Ligaments Intact Lisfranc ligament.  No acute collateral ligament injury. Muscles and Tendons Denervation changes of the foot musculature. The third toe flexor tendon is ill-defined at the level of the soft tissue ulceration and may be torn. Mild tenosynovitis. Soft tissues Soft tissue ulceration at the plantar aspect of the forefoot underlying the third MTP joint. Large complex fluid collection at the plantar aspect of the third toe and extending between the second and third toes measuring 3.6 x 2.6 x 3.3 cm. Prominent subcutaneous edema throughout the dorsum of the foot. IMPRESSION: 1. Soft tissue ulceration at the plantar aspect of the forefoot underlying the third MTP joint. Large complex fluid collection at the plantar aspect of the third toe and extending between the second and third toes measuring 3.6 x 2.6 x 3.3 cm, compatible with abscess. 2. Findings of acute osteomyelitis involving the proximal, middle, and distal phalanx of the third toe as well as the third metatarsal head and diaphysis. Large complex third MTP joint effusion is compatible with septic arthritis. The third MTP joint is dorsally dislocated. 3. Milder bone marrow edema within the fourth toe proximal phalanx, fourth metatarsal head as well as within the bones of the second toe, which may represent reactive osteitis or early osteomyelitis. 4. The third toe flexor  tendon is ill-defined at the level of the soft tissue ulceration and may be torn. Mild tenosynovitis. Electronically Signed   By: Duanne Guess D.O.   On: 04/30/2023 08:28   DG Foot Complete Left  Result Date: 04/29/2023 CLINICAL DATA:  Worsening left foot infection, third digit erythema EXAM: LEFT FOOT - COMPLETE 3+ VIEW COMPARISON:  07/07/2019 FINDINGS: Frontal, oblique, and lateral views of the left foot are obtained. There is diffuse soft tissue edema, greatest in the forefoot and first through third digits. Erosive changes are seen at the base of third proximal phalanx and head of the third metatarsal consistent with acute osteomyelitis. Evidence of chronic osteomyelitis at the first interphalangeal joint and second metatarsophalangeal joints. No acute fractures. Mild midfoot osteoarthritis. IMPRESSION: 1. Diffuse soft tissue edema consistent with cellulitis. Erosive changes at the base of the third proximal phalanx and head of the third metatarsal consistent with acute osteomyelitis. 2. Sequela of chronic osteomyelitis at the first interphalangeal and second metatarsophalangeal joints. 3. Osteoarthritis. Electronically Signed   By: Sharlet Salina M.D.   On: 04/29/2023 20:20        Sunnie Nielsen, DO [facility] 04/30/2023, 4:46 PM    Dictation software may have been used to generate the above note. Typos may occur and escape review in typed/dictated notes. Please contact Dr Lyn Hollingshead directly for clarity if needed.  Staff may message me via secure chat in Epic  but this may not receive an immediate response,  please page me for urgent matters!  If 7PM-7AM, please contact night coverage [email or web address]

## 2023-05-01 ENCOUNTER — Other Ambulatory Visit: Payer: Self-pay

## 2023-05-01 LAB — GLUCOSE, CAPILLARY
Glucose-Capillary: 133 mg/dL — ABNORMAL HIGH (ref 70–99)
Glucose-Capillary: 238 mg/dL — ABNORMAL HIGH (ref 70–99)
Glucose-Capillary: 331 mg/dL — ABNORMAL HIGH (ref 70–99)
Glucose-Capillary: 189 mg/dL — ABNORMAL HIGH (ref 70–99)

## 2023-05-01 MED ORDER — CHLORHEXIDINE GLUCONATE 4 % EX SOLN
60.0000 mL | Freq: Once | CUTANEOUS | Status: AC
  Administered 2023-05-02: 4 via TOPICAL

## 2023-05-01 MED ORDER — VANCOMYCIN HCL 10 G IV SOLR
1750.0000 mg | INTRAVENOUS | Status: DC
  Administered 2023-05-01: 1749.978 mg via INTRAVENOUS
  Filled 2023-05-01: qty 17.5
  Filled 2023-05-01: qty 1750

## 2023-05-01 MED ORDER — VANCOMYCIN HCL 1750 MG/350ML IV SOLN
1750.0000 mg | INTRAVENOUS | Status: DC
  Filled 2023-05-01: qty 350

## 2023-05-01 MED ORDER — SODIUM CHLORIDE 0.9 % IV SOLN
3.0000 g | Freq: Four times a day (QID) | INTRAVENOUS | Status: DC
  Administered 2023-05-01 – 2023-05-03 (×9): 3 g via INTRAVENOUS
  Filled 2023-05-01 (×11): qty 8

## 2023-05-01 NOTE — Progress Notes (Signed)
Pharmacy Antibiotic Note  [name] [name] is a 62 y.o. male admitted on 04/29/2023 with  Osteomyelitis .  Pharmacy has been consulted for Cefepime and Vancomycin dosing.  Plan: Adjust current vancomycin regimen to vancomycin 1750 mg IV Q24H. Goal AUC 400-550. Expected AUC: 500.2 Expected Css min: 11.5 SCr used: 1.46  Weight used: IBW, Vd used: 0.5 (BMI 39.6) Continue cefepime 2 g IV Q8H Patient is also on metronidazole 500 g IV Q12H Continue to monitor renal function and follow culture results   Height: 6' (182.9 cm) Weight: 132.5 kg (292 lb) IBW/kg (Calculated) : 77.6  Temp (24hrs), Avg:97.9 F (36.6 C), Min:97.6 F (36.4 C), Max:98.2 F (36.8 C)  Recent Labs  Lab 04/29/23 1930 04/30/23 0028 04/30/23 0747  WBC 17.5*  --  12.4*  CREATININE 1.72*  --  1.46*  LATICACIDVEN 2.4* 1.9  --     Estimated Creatinine Clearance: 74.9 mL/min (A) (by C-G formula based on SCr of 1.46 mg/dL (H)).    No Known Allergies  Antimicrobials this admission: Cefepime 12/10 >>  Vancomycin 12/10 >>   Dose adjustments this admission:  Vanc 1500 mg IV q24h-->Vanc 1750 mg IV q24h  Microbiology results:   Thank you for allowing pharmacy to be a part of this patient's care.  Paulita Fujita, PharmD Clinical Pharmacist 05/01/2023 9:45 AM

## 2023-05-01 NOTE — Progress Notes (Signed)
Daily Progress Note   Subjective  - * No surgery found *  Follow-up left foot infection.  No complaints at this time.  Objective Vitals:   04/30/23 2002 04/30/23 2044 05/01/23 0820 05/01/23 1559  BP: (!) 112/98 133/73 (!) 146/77 133/77  Pulse: 88 87 76 86  Resp: 20 19  18   Temp: 98.2 F (36.8 C) 98 F (36.7 C) 97.7 F (36.5 C) 97.7 F (36.5 C)  TempSrc: Oral Oral Oral   SpO2: 95% 92% 96% 93%  Weight:      Height:        Physical Exam: Severe diffuse infection to the left forefoot.  Abscess around the third MTPJ extending around the second MTPJ.  Areas of bony edema around the second and fourth MTPJ's with obvious osteomyelitis of the third metatarsophalangeal joint region.  Laboratory CBC    Component Value Date/Time   WBC 12.4 (H) 04/30/2023 0747   HGB 12.1 (L) 04/30/2023 0747   HCT 37.2 (L) 04/30/2023 0747   PLT 329 04/30/2023 0747    BMET    Component Value Date/Time   NA 137 04/30/2023 0747   NA 142 06/26/2015 1605   K 3.7 04/30/2023 0747   CL 102 04/30/2023 0747   CO2 23 04/30/2023 0747   GLUCOSE 223 (H) 04/30/2023 0747   BUN 32 (H) 04/30/2023 0747   BUN 16 06/26/2015 1605   CREATININE 1.46 (H) 04/30/2023 0747   CALCIUM 8.5 (L) 04/30/2023 0747   GFRNONAA 54 (L) 04/30/2023 0747   GFRAA >60 07/10/2019 0540    Assessment/Planning: Osteomyelitis with severe abscess left forefoot Diabetic foot ulcer and diabetic foot infection left foot Diabetes with neuropathy  Will plan for surgical invention tomorrow.  I discussed the amount of severe infection will likely require removal of all the toes and metatarsophalangeal joint regions.  I discussed a transmetatarsal amputation.  Will attempt primary closure but I suspect we will have to allow an area for drainage.  We discussed the strong possibility of return to the OR given the amount of infection on MRI and clinically.  I discussed the risk benefits alternatives and complications associated with surgery.   Consent has been given.  Will plan on surgery tomorrow.  Orders for surgery have been written.   Gwyneth Revels A  05/01/2023, 7:14 PM

## 2023-05-01 NOTE — Plan of Care (Signed)
  Problem: Fluid Volume: Goal: Hemodynamic stability will improve Outcome: Progressing   Problem: Clinical Measurements: Goal: Diagnostic test results will improve Outcome: Progressing Goal: Signs and symptoms of infection will decrease Outcome: Progressing   Problem: Respiratory: Goal: Ability to maintain adequate ventilation will improve Outcome: Progressing   Problem: Education: Goal: Ability to describe self-care measures that may prevent or decrease complications (Diabetes Survival Skills Education) will improve Outcome: Progressing   Problem: Education: Goal: Individualized Educational Video(s) Outcome: Progressing   Problem: Coping: Goal: Ability to adjust to condition or change in health will improve Outcome: Progressing   Problem: Fluid Volume: Goal: Ability to maintain a balanced intake and output will improve Outcome: Progressing   Problem: Metabolic: Goal: Ability to maintain appropriate glucose levels will improve Outcome: Progressing

## 2023-05-01 NOTE — Progress Notes (Signed)
PROGRESS NOTE    [name] [name]   ZOX:[id number] DOB: [birth year]  DOA: 04/29/2023 Date of Service: 05/01/23 which is hospital day 2  PCP: Shane Crutch, PA    HPI: [name] is a 62 year old male with history of non-insulin-dependent diabetes mellitus, hypertension, hyperlipidemia, morbid obesity, who presents emergency department for chief concerns of left third toe infection, failing outpatient therapy. L foot started draining for approximately 1-1/2 weeks. PCP prescribed doxycycline 100 mg p.o. twice daily which he started on 04/22/2023. He reports he has 3 more days left of this Rx however it got worse so he decided to come to the ED  Hospital course / significant events:  12/10: admitted to hospitalist service for L foot osteo, tc broad spectrum abx cefepime, vanc, metronidazole. Podiatry to see in AM.  12/11: podiatry recs for I&D with removal of infected necrotic tissue, and likely will need transmetatarsal amputation - OR planned for Friday 12/13 12/12: stable   Consultants:  Podiatry   Procedures/Surgeries: None       ASSESSMENT & PLAN:   Severe Sepsis d/t Foot osteomyelitis, left Meeting sepsis criteria w/ leukocytosis, elevated HR and RR though high HR was transient, elevated lactic acid, AKI  podiatry recs for I&D with removal of infected necrotic tissue, and likely will need transmetatarsal amputation - OR planned for Friday 12/13 Cefepime, vancomycin, metronidazole Follow blood cultures    Diabetes mellitus type 2, noninsulin dependent (HCC) No recent A1c, last 1 was 8.1% in 2021 Check A1c --> above goal Insulin SSI with at bedtime coverage ordered Goal inpatient blood glucose levels 140-180   AKI  Cr on admission 1.72 (baseline 0.9-1.1)  Monitor BMP s/p IV fluids    Morbid obesity  This meets criteria for morbid obesity based on the presence of 1 or more chronic comorbidities. Patient has NIDDM2, hypertension and BMI of 39.6.  complicates overall  care and prognosis. Weight management outpatient     Hyperlipidemia Simvastatin 40 mg nightly  Of note, patient took his Zocor from home in the emergency department.   Hypertension Hydralazine 5 mg IV every 6 hours as needed for SBP greater 175   Morbid ovesity based on BMI: Body mass index is 39.6 kg/m.  Underweight - under 18.5  normal weight - 18.5 to 24.9 overweight - 25 to 29.9 obese - 30 or more   DVT prophylaxis: lovenox  IV fluids: no continuous IV fluids  Nutrition: diabetic diet, plan for NPO at midnight  Central lines / invasive devices: none  Code Status: FULL CODE ACP documentation reviewed:  none on file in [facility]  TOC needs: TBD Barriers to dispo / significant pending items: pending surgery tomorrow              Subjective / Brief ROS:  Patient reports no concerns Denies CP/SOB.  Pain controlled.  Denies new weakness.   Family Communication: none at this time     Objective Findings:  Vitals:   04/30/23 1700 04/30/23 2002 04/30/23 2044 05/01/23 0820  BP: 118/83 (!) 112/98 133/73 (!) 146/77  Pulse: (!) 119 88 87 76  Resp: (!) 24 20 19    Temp:  98.2 F (36.8 C) 98 F (36.7 C) 97.7 F (36.5 C)  TempSrc:  Oral Oral Oral  SpO2: 96% 95% 92% 96%  Weight:      Height:       No intake or output data in the 24 hours ending 05/01/23 1429  Filed Weights   04/29/23 1928  Weight: 132.5  kg    Examination:  Physical Exam Constitutional:      Appearance: Normal appearance. He is obese.  Cardiovascular:     Rate and Rhythm: Normal rate and regular rhythm.  Pulmonary:     Effort: Pulmonary effort is normal.     Breath sounds: Normal breath sounds.  Abdominal:     Palpations: Abdomen is soft.  Skin:    General: Skin is warm and dry.  Neurological:     General: No focal deficit present.     Mental Status: He is alert and oriented to person, place, and time.  Psychiatric:        Mood and Affect: Mood normal.        Behavior: Behavior  normal.          Scheduled Medications:   enoxaparin (LOVENOX) injection  60 mg Subcutaneous Q24H   insulin aspart  0-5 Units Subcutaneous QHS   insulin aspart  0-9 Units Subcutaneous TID WC   simvastatin  40 mg Oral QHS    Continuous Infusions:  ampicillin-sulbactam (UNASYN) IV 3 g (05/01/23 1251)   vancomycin      PRN Medications:  acetaminophen **OR** acetaminophen, hydrALAZINE, ondansetron **OR** ondansetron (ZOFRAN) IV, senna-docusate  Antimicrobials from admission:  Anti-infectives (From admission, onward)    Start     Dose/Rate Route Frequency Ordered Stop   05/01/23 2200  vancomycin (VANCOREADY) IVPB 1750 mg/350 mL        1,750 mg 175 mL/hr over 120 Minutes Intravenous Every 24 hours 05/01/23 0944     05/01/23 1200  Ampicillin-Sulbactam (UNASYN) 3 g in sodium chloride 0.9 % 100 mL IVPB        3 g 200 mL/hr over 30 Minutes Intravenous Every 6 hours 05/01/23 1001     04/30/23 2200  Vancomycin (VANCOCIN) 1,500 mg in sodium chloride 0.9 % 500 mL IVPB  Status:  Discontinued        1,500 mg 250 mL/hr over 120 Minutes Intravenous Every 24 hours 04/29/23 2156 05/01/23 0944   04/30/23 0600  ceFEPIme (MAXIPIME) 2 g in sodium chloride 0.9 % 100 mL IVPB  Status:  Discontinued        2 g 200 mL/hr over 30 Minutes Intravenous Every 8 hours 04/29/23 2156 05/01/23 1001   04/29/23 2200  metroNIDAZOLE (FLAGYL) IVPB 500 mg  Status:  Discontinued       Note to Pharmacy: His left toe is terrible looking   500 mg 100 mL/hr over 60 Minutes Intravenous Every 12 hours 04/29/23 2145 05/01/23 1001   04/29/23 2200  vancomycin (VANCOREADY) IVPB 2000 mg/400 mL        2,000 mg 200 mL/hr over 120 Minutes Intravenous  Once 04/29/23 2156 04/30/23 0301   04/29/23 2130  metroNIDAZOLE (FLAGYL) IVPB 500 mg  Status:  Discontinued        500 mg 100 mL/hr over 60 Minutes Intravenous Every 12 hours 04/29/23 2117 04/29/23 2145   04/29/23 2130  ceFEPIme (MAXIPIME) 2 g in sodium chloride 0.9 % 100 mL  IVPB        2 g 200 mL/hr over 30 Minutes Intravenous  Once 04/29/23 2120 04/29/23 2203           Data Reviewed:  I have personally reviewed the following...  CBC: Recent Labs  Lab 04/29/23 1930 04/30/23 0747  WBC 17.5* 12.4*  NEUTROABS 12.5*  --   HGB 13.6 12.1*  HCT 41.3 37.2*  MCV 91.6 92.5  PLT 420* 329   Basic  Metabolic Panel: Recent Labs  Lab 04/29/23 1930 04/30/23 0747  NA 135 137  K 3.9 3.7  CL 96* 102  CO2 27 23  GLUCOSE 291* 223*  BUN 34* 32*  CREATININE 1.72* 1.46*  CALCIUM 9.1 8.5*   GFR: Estimated Creatinine Clearance: 74.9 mL/min (A) (by C-G formula based on SCr of 1.46 mg/dL (H)). Liver Function Tests: No results for input(s): "AST", "ALT", "ALKPHOS", "BILITOT", "PROT", "ALBUMIN" in the last 168 hours. No results for input(s): "LIPASE", "AMYLASE" in the last 168 hours. No results for input(s): "AMMONIA" in the last 168 hours. Coagulation Profile: No results for input(s): "INR", "PROTIME" in the last 168 hours. Cardiac Enzymes: No results for input(s): "CKTOTAL", "CKMB", "CKMBINDEX", "TROPONINI" in the last 168 hours. BNP (last 3 results) No results for input(s): "PROBNP" in the last 8760 hours. HbA1C: Recent Labs    04/29/23 1930  HGBA1C 9.8*   CBG: Recent Labs  Lab 04/30/23 1155 04/30/23 1638 04/30/23 2352 05/01/23 0828 05/01/23 1158  GLUCAP 136* 131* 138* 133* 238*   Lipid Profile: No results for input(s): "CHOL", "HDL", "LDLCALC", "TRIG", "CHOLHDL", "LDLDIRECT" in the last 72 hours. Thyroid Function Tests: No results for input(s): "TSH", "T4TOTAL", "FREET4", "T3FREE", "THYROIDAB" in the last 72 hours. Anemia Panel: No results for input(s): "VITAMINB12", "FOLATE", "FERRITIN", "TIBC", "IRON", "RETICCTPCT" in the last 72 hours. Most Recent Urinalysis On File:  No results found for: "COLORURINE", "APPEARANCEUR", "LABSPEC", "PHURINE", "GLUCOSEU", "HGBUR", "BILIRUBINUR", "KETONESUR", "PROTEINUR", "UROBILINOGEN", "NITRITE",  "LEUKOCYTESUR" Sepsis Labs: @LABRCNTIP (procalcitonin:4,lacticidven:4) Microbiology: Recent Results (from the past 240 hours)  Blood Cultures x 2 sites     Status: None (Preliminary result)   Collection Time: 04/30/23 12:28 AM   Specimen: BLOOD LEFT ARM  Result Value Ref Range Status   Specimen Description BLOOD LEFT ARM  Final   Special Requests   Final    BOTTLES DRAWN AEROBIC AND ANAEROBIC Blood Culture results may not be optimal due to an inadequate volume of blood received in culture bottles   Culture   Final    NO GROWTH 1 DAY Performed at [facility], [address], [place], [place] [address]    Report Status PENDING  Incomplete  Blood Cultures x 2 sites     Status: None (Preliminary result)   Collection Time: 04/30/23 12:28 AM   Specimen: BLOOD LEFT ARM  Result Value Ref Range Status   Specimen Description BLOOD LEFT ARM  Final   Special Requests   Final    BOTTLES DRAWN AEROBIC AND ANAEROBIC Blood Culture results may not be optimal due to an inadequate volume of blood received in culture bottles   Culture   Final    NO GROWTH 1 DAY Performed at [facility], [address], [place], [place] [address]    Report Status PENDING  Incomplete      Radiology Studies last 3 days: US ARTERIAL ABI (SCREENING LOWER EXTREMITY) Result Date: 04/30/2023 CLINICAL DATA:  Osteomyelitis of the left foot Hypertension Hyperlipidemia Diabetes EXAM: NONINVASIVE PHYSIOLOGIC VASCULAR STUDY OF BILATERAL LOWER EXTREMITIES TECHNIQUE: Evaluation of both lower extremities were performed at rest, including calculation of ankle-brachial indices with single level pressure measurements and doppler recording. COMPARISON:  None available. FINDINGS: Right ABI:  1.38 Left ABI:  1.29 Right Lower Extremity:  Normal arterial waveforms at the ankle. Left Lower Extremity:  Normal arterial waveforms at the ankle. 1.0-1.4 Normal IMPRESSION: Normal ankle-brachial indices. Electronically Signed    By: Acquanetta Belling M.D.   On: 04/30/2023 11:57   MR FOOT  LEFT WO CONTRAST Result Date: 04/30/2023 CLINICAL DATA:  Diabetic foot infection EXAM: MRI OF THE LEFT FOOT WITHOUT CONTRAST TECHNIQUE: Multiplanar, multisequence MR imaging of the left forefoot was performed. No intravenous contrast was administered. COMPARISON:  X-ray 04/29/2023 FINDINGS: Bones/Joint/Cartilage Findings of acute osteomyelitis involving the proximal, middle, and distal phalanx of the third toe as well as the third metatarsal head and diaphysis. Intense bone marrow edema within these bones with confluent low T1 marrow signal changes. Large complex third MTP joint effusion is compatible with septic arthritis. Milder bone marrow edema within the fourth toe proximal phalanx, fourth metatarsal head as well as within the bones of the second toe, which may represent reactive osteitis or early osteomyelitis. The third MTP joint is dorsally dislocated. The great toe IP joint is in extension. No fractures. Degenerative changes are most pronounced at the second MTP joint. Ligaments Intact Lisfranc ligament.  No acute collateral ligament injury. Muscles and Tendons Denervation changes of the foot musculature. The third toe flexor tendon is ill-defined at the level of the soft tissue ulceration and may be torn. Mild tenosynovitis. Soft tissues Soft tissue ulceration at the plantar aspect of the forefoot underlying the third MTP joint. Large complex fluid collection at the plantar aspect of the third toe and extending between the second and third toes measuring 3.6 x 2.6 x 3.3 cm. Prominent subcutaneous edema throughout the dorsum of the foot. IMPRESSION: 1. Soft tissue ulceration at the plantar aspect of the forefoot underlying the third MTP joint. Large complex fluid collection at the plantar aspect of the third toe and extending between the second and third toes measuring 3.6 x 2.6 x 3.3 cm, compatible with abscess. 2. Findings of acute osteomyelitis  involving the proximal, middle, and distal phalanx of the third toe as well as the third metatarsal head and diaphysis. Large complex third MTP joint effusion is compatible with septic arthritis. The third MTP joint is dorsally dislocated. 3. Milder bone marrow edema within the fourth toe proximal phalanx, fourth metatarsal head as well as within the bones of the second toe, which may represent reactive osteitis or early osteomyelitis. 4. The third toe flexor tendon is ill-defined at the level of the soft tissue ulceration and may be torn. Mild tenosynovitis. Electronically Signed   By: Duanne Guess D.O.   On: 04/30/2023 08:28   DG Foot Complete Left Result Date: 04/29/2023 CLINICAL DATA:  Worsening left foot infection, third digit erythema EXAM: LEFT FOOT - COMPLETE 3+ VIEW COMPARISON:  07/07/2019 FINDINGS: Frontal, oblique, and lateral views of the left foot are obtained. There is diffuse soft tissue edema, greatest in the forefoot and first through third digits. Erosive changes are seen at the base of third proximal phalanx and head of the third metatarsal consistent with acute osteomyelitis. Evidence of chronic osteomyelitis at the first interphalangeal joint and second metatarsophalangeal joints. No acute fractures. Mild midfoot osteoarthritis. IMPRESSION: 1. Diffuse soft tissue edema consistent with cellulitis. Erosive changes at the base of the third proximal phalanx and head of the third metatarsal consistent with acute osteomyelitis. 2. Sequela of chronic osteomyelitis at the first interphalangeal and second metatarsophalangeal joints. 3. Osteoarthritis. Electronically Signed   By: Sharlet Salina M.D.   On: 04/29/2023 20:20        Sunnie Nielsen, DO [facility] 05/01/2023, 2:29 PM    Dictation software may have been used to generate the above note. Typos may occur and escape review in typed/dictated notes. Please contact Dr Lyn Hollingshead directly for clarity  if needed.  Staff  may message me via secure chat in Epic  but this may not receive an immediate response,  please page me for urgent matters!  If 7PM-7AM, please contact night coverage [email or web address]

## 2023-05-02 ENCOUNTER — Inpatient Hospital Stay: Payer: Self-pay | Admitting: Anesthesiology

## 2023-05-02 ENCOUNTER — Encounter
Admission: EM | Disposition: A | Discharge status: Home Health | Payer: Self-pay | Source: Home / Self Care | Attending: Osteopathic Medicine

## 2023-05-02 ENCOUNTER — Inpatient Hospital Stay: Payer: Self-pay

## 2023-05-02 ENCOUNTER — Other Ambulatory Visit: Payer: Self-pay

## 2023-05-02 HISTORY — PX: TRANSMETATARSAL AMPUTATION: SHX6197

## 2023-05-02 HISTORY — PX: IRRIGATION AND DEBRIDEMENT FOOT: SHX6602

## 2023-05-02 LAB — SURGICAL PCR SCREEN
MRSA, PCR: NEGATIVE
Staphylococcus aureus: NEGATIVE

## 2023-05-02 LAB — GLUCOSE, CAPILLARY
Glucose-Capillary: 176 mg/dL — ABNORMAL HIGH (ref 70–99)
Glucose-Capillary: 186 mg/dL — ABNORMAL HIGH (ref 70–99)
Glucose-Capillary: 183 mg/dL — ABNORMAL HIGH (ref 70–99)
Glucose-Capillary: 181 mg/dL — ABNORMAL HIGH (ref 70–99)
Glucose-Capillary: 200 mg/dL — ABNORMAL HIGH (ref 70–99)
Glucose-Capillary: 201 mg/dL — ABNORMAL HIGH (ref 70–99)
Glucose-Capillary: 164 mg/dL — ABNORMAL HIGH (ref 70–99)

## 2023-05-02 LAB — BASIC METABOLIC PANEL
Anion gap: 11 (ref 5–15)
BUN: 20 mg/dL (ref 8–23)
CO2: 23 mmol/L (ref 22–32)
Calcium: 8.9 mg/dL (ref 8.9–10.3)
Chloride: 102 mmol/L (ref 98–111)
Creatinine, Ser: 1.03 mg/dL (ref 0.61–1.24)
GFR, Estimated: >60 mL/min (ref >60)
Glucose, Bld: 206 mg/dL — ABNORMAL HIGH (ref 70–99)
Potassium: 4.1 mmol/L (ref 3.5–5.1)
Sodium: 136 mmol/L (ref 135–145)

## 2023-05-02 LAB — CBC
HCT: 38 % — ABNORMAL LOW (ref 39.0–52.0)
Hemoglobin: 12.6 g/dL — ABNORMAL LOW (ref 13.0–17.0)
MCH: 29.4 pg (ref 26.0–34.0)
MCHC: 33.2 g/dL (ref 30.0–36.0)
MCV: 88.8 fL (ref 80.0–100.0)
Platelets: 387 10*3/uL (ref 150–400)
RBC: 4.28 MIL/uL (ref 4.22–5.81)
RDW: 12.5 % (ref 11.5–15.5)
WBC: 11.5 10*3/uL — ABNORMAL HIGH (ref 4.0–10.5)
nRBC: 0 % (ref 0.0–0.2)

## 2023-05-02 SURGERY — AMPUTATION, FOOT, TRANSMETATARSAL
Anesthesia: General | Site: Foot | Laterality: Left

## 2023-05-02 MED ORDER — HYDROCORTISONE 1 % EX CREA
TOPICAL_CREAM | Freq: Three times a day (TID) | CUTANEOUS | Status: DC
  Administered 2023-05-03: 1 via TOPICAL
  Filled 2023-05-02: qty 28

## 2023-05-02 MED ORDER — POVIDONE-IODINE 10 % EX SWAB
2.0000 | Freq: Once | CUTANEOUS | Status: DC
Start: 2023-05-02 — End: 2023-05-02

## 2023-05-02 MED ORDER — VANCOMYCIN HCL 1250 MG/250ML IV SOLN
1250.0000 mg | Freq: Two times a day (BID) | INTRAVENOUS | Status: DC
  Administered 2023-05-02 – 2023-05-03 (×2): 1250 mg via INTRAVENOUS
  Filled 2023-05-02 (×3): qty 250

## 2023-05-02 MED ORDER — ONDANSETRON HCL 4 MG/2ML IJ SOLN
INTRAMUSCULAR | Status: DC | PRN
  Administered 2023-05-02: 4 mg via INTRAVENOUS

## 2023-05-02 MED ORDER — ACETAMINOPHEN 10 MG/ML IV SOLN
INTRAVENOUS | Status: DC | PRN
  Administered 2023-05-02: 1000 mg via INTRAVENOUS

## 2023-05-02 MED ORDER — DEXMEDETOMIDINE HCL IN NACL 200 MCG/50ML IV SOLN
INTRAVENOUS | Status: DC | PRN
  Administered 2023-05-02 (×2): 4 ug via INTRAVENOUS

## 2023-05-02 MED ORDER — OXYCODONE HCL 5 MG PO TABS
10.0000 mg | ORAL_TABLET | ORAL | Status: DC | PRN
  Administered 2023-05-02 – 2023-05-03 (×3): 10 mg via ORAL
  Filled 2023-05-02 (×3): qty 2

## 2023-05-02 MED ORDER — MIDAZOLAM HCL 2 MG/2ML IJ SOLN
INTRAMUSCULAR | Status: AC
Start: 2023-05-02 — End: ?
  Filled 2023-05-02: qty 2

## 2023-05-02 MED ORDER — PROPOFOL 10 MG/ML IV BOLUS
INTRAVENOUS | Status: DC | PRN
  Administered 2023-05-02: 200 mg via INTRAVENOUS

## 2023-05-02 MED ORDER — PROPOFOL 10 MG/ML IV BOLUS
INTRAVENOUS | Status: AC
Start: 2023-05-02 — End: ?
  Filled 2023-05-02: qty 20

## 2023-05-02 MED ORDER — ONDANSETRON HCL 4 MG/2ML IJ SOLN
INTRAMUSCULAR | Status: AC
Start: 2023-05-02 — End: ?
  Filled 2023-05-02: qty 2

## 2023-05-02 MED ORDER — 0.9 % SODIUM CHLORIDE (POUR BTL) OPTIME
TOPICAL | Status: DC | PRN
  Administered 2023-05-02: 500 mL

## 2023-05-02 MED ORDER — BUPIVACAINE HCL 0.25 % IJ SOLN
INTRAMUSCULAR | Status: DC | PRN
  Administered 2023-05-02: 20 mL

## 2023-05-02 MED ORDER — SURGIFLO WITH THROMBIN (HEMOSTATIC MATRIX KIT) OPTIME
TOPICAL | Status: DC | PRN
  Administered 2023-05-02: 1 via TOPICAL

## 2023-05-02 MED ORDER — MORPHINE SULFATE (PF) 2 MG/ML IV SOLN
2.0000 mg | INTRAVENOUS | Status: AC | PRN
  Administered 2023-05-02 (×2): 2 mg via INTRAVENOUS
  Filled 2023-05-02 (×2): qty 1

## 2023-05-02 MED ORDER — OXYCODONE HCL 5 MG/5ML PO SOLN: 5.0000 mg | Freq: Once | ORAL | Status: AC | PRN

## 2023-05-02 MED ORDER — LIDOCAINE HCL (CARDIAC) PF 100 MG/5ML IV SOSY
PREFILLED_SYRINGE | INTRAVENOUS | Status: DC | PRN
  Administered 2023-05-02: 100 mg via INTRAVENOUS

## 2023-05-02 MED ORDER — KETOROLAC TROMETHAMINE 30 MG/ML IJ SOLN
INTRAMUSCULAR | Status: AC
  Filled 2023-05-02: qty 1

## 2023-05-02 MED ORDER — LIDOCAINE HCL (PF) 2 % IJ SOLN
INTRAMUSCULAR | Status: AC
  Filled 2023-05-02: qty 5

## 2023-05-02 MED ORDER — VANCOMYCIN HCL 1000 MG IV SOLR
INTRAVENOUS | Status: AC
  Filled 2023-05-02: qty 20

## 2023-05-02 MED ORDER — SODIUM CHLORIDE 0.9 % IV SOLN: INTRAVENOUS | Status: DC | PRN

## 2023-05-02 MED ORDER — FENTANYL CITRATE (PF) 100 MCG/2ML IJ SOLN
25.0000 ug | INTRAMUSCULAR | Status: DC | PRN
Start: 2023-05-02 — End: 2023-05-02
  Administered 2023-05-02 (×2): 50 ug via INTRAVENOUS

## 2023-05-02 MED ORDER — OXYCODONE HCL 5 MG PO TABS
ORAL_TABLET | ORAL | Status: AC
  Filled 2023-05-02: qty 1

## 2023-05-02 MED ORDER — BUPIVACAINE HCL (PF) 0.25 % IJ SOLN
INTRAMUSCULAR | Status: AC
Start: 2023-05-02 — End: ?
  Filled 2023-05-02: qty 30

## 2023-05-02 MED ORDER — FENTANYL CITRATE (PF) 100 MCG/2ML IJ SOLN
INTRAMUSCULAR | Status: DC | PRN
  Administered 2023-05-02 (×2): 50 ug via INTRAVENOUS

## 2023-05-02 MED ORDER — OXYCODONE HCL 5 MG PO TABS
5.0000 mg | ORAL_TABLET | Freq: Once | ORAL | Status: AC | PRN
  Administered 2023-05-02: 5 mg via ORAL

## 2023-05-02 MED ORDER — LIVING WELL WITH DIABETES BOOK
Freq: Once | Status: AC
  Filled 2023-05-02: qty 1

## 2023-05-02 MED ORDER — FENTANYL CITRATE (PF) 100 MCG/2ML IJ SOLN
INTRAMUSCULAR | Status: AC
  Filled 2023-05-02: qty 2

## 2023-05-02 MED ORDER — ACETAMINOPHEN 10 MG/ML IV SOLN
INTRAVENOUS | Status: AC
  Filled 2023-05-02: qty 100

## 2023-05-02 MED ORDER — MELATONIN 5 MG PO TABS
5.0000 mg | ORAL_TABLET | Freq: Every evening | ORAL | Status: DC | PRN
  Administered 2023-05-02: 5 mg via ORAL
  Filled 2023-05-02: qty 1

## 2023-05-02 MED ORDER — SUCCINYLCHOLINE CHLORIDE 200 MG/10ML IV SOSY
PREFILLED_SYRINGE | INTRAVENOUS | Status: DC | PRN
  Administered 2023-05-02: 140 mg via INTRAVENOUS

## 2023-05-02 SURGICAL SUPPLY — 74 items
BAG COUNTER SPONGE SURGICOUNT (BAG) IMPLANT
BLADE MED AGGRESSIVE (BLADE) IMPLANT
BLADE OSC/SAGITTAL 5.5X25 (BLADE) ×1 IMPLANT
BLADE OSC/SAGITTAL MD 5.5X18 (BLADE) IMPLANT
BLADE SURG 15 STRL LF DISP TIS (BLADE) ×1 IMPLANT
BLADE SW THK.38XMED LNG THN (BLADE) IMPLANT
BNDG COHESIVE 4X5 TAN STRL LF (GAUZE/BANDAGES/DRESSINGS) ×1 IMPLANT
BNDG COHESIVE 6X5 TAN ST LF (GAUZE/BANDAGES/DRESSINGS) ×1 IMPLANT
BNDG ELASTIC 4INX 5YD STR LF (GAUZE/BANDAGES/DRESSINGS) ×1 IMPLANT
BNDG ELASTIC 4X5.8 VLCR NS LF (GAUZE/BANDAGES/DRESSINGS) ×1 IMPLANT
BNDG ESMARCH 4X12 STRL LF (GAUZE/BANDAGES/DRESSINGS) ×1 IMPLANT
BNDG GAUZE DERMACEA FLUFF 4 (GAUZE/BANDAGES/DRESSINGS) ×1 IMPLANT
BNDG STRETCH 4X75 STRL LF (GAUZE/BANDAGES/DRESSINGS) ×1 IMPLANT
BNDG STRETCH GAUZE 3IN X12FT (GAUZE/BANDAGES/DRESSINGS) ×1 IMPLANT
CANISTER WOUND CARE 500ML ATS (WOUND CARE) ×1 IMPLANT
CNTNR URN SCR LID CUP LEK RST (MISCELLANEOUS) ×1 IMPLANT
CUFF TOURN SGL QUICK 12 (TOURNIQUET CUFF) IMPLANT
CUFF TOURN SGL QUICK 18X4 (TOURNIQUET CUFF) IMPLANT
DRAIN PENROSE 12X.25 LTX STRL (MISCELLANEOUS) IMPLANT
DRAPE FLUOR MINI C-ARM 54X84 (DRAPES) IMPLANT
DRAPE XRAY CASSETTE 23X24 (DRAPES) IMPLANT
DRSG MEPILEX FLEX 3X3 (GAUZE/BANDAGES/DRESSINGS) IMPLANT
DURAPREP 26ML APPLICATOR (WOUND CARE) ×1 IMPLANT
ELECT REM PT RETURN 9FT ADLT (ELECTROSURGICAL) ×1
ELECTRODE REM PT RTRN 9FT ADLT (ELECTROSURGICAL) ×1 IMPLANT
GAUZE PACKING 0.25INX5YD STRL (GAUZE/BANDAGES/DRESSINGS) ×1 IMPLANT
GAUZE SPONGE 4X4 12PLY STRL (GAUZE/BANDAGES/DRESSINGS) ×2 IMPLANT
GAUZE STRETCH 2X75IN STRL (MISCELLANEOUS) ×1 IMPLANT
GAUZE XEROFORM 1X8 LF (GAUZE/BANDAGES/DRESSINGS) ×1 IMPLANT
GLOVE BIO SURGEON STRL SZ7.5 (GLOVE) ×1 IMPLANT
GLOVE INDICATOR 8.0 STRL GRN (GLOVE) ×1 IMPLANT
GOWN STRL REUS W/ TWL LRG LVL3 (GOWN DISPOSABLE) ×1 IMPLANT
GOWN STRL REUS W/ TWL XL LVL3 (GOWN DISPOSABLE) ×1 IMPLANT
GOWN STRL REUS W/TWL MED LVL3 (GOWN DISPOSABLE) ×1 IMPLANT
GOWN STRL REUS W/TWL XL LVL4 (GOWN DISPOSABLE) ×1 IMPLANT
HANDLE YANKAUER SUCT BULB TIP (MISCELLANEOUS) IMPLANT
HANDPIECE VERSAJET DEBRIDEMENT (MISCELLANEOUS) IMPLANT
IV NS 1000ML BAXH (IV SOLUTION) ×1 IMPLANT
IV NS IRRIG 3000ML ARTHROMATIC (IV SOLUTION) ×1 IMPLANT
KIT DRSG VAC SLVR GRANUFM (MISCELLANEOUS) ×1 IMPLANT
KIT TURNOVER KIT A (KITS) ×1 IMPLANT
LABEL OR SOLS (LABEL) ×1 IMPLANT
MANIFOLD NEPTUNE II (INSTRUMENTS) ×1 IMPLANT
NDL FILTER BLUNT 18X1 1/2 (NEEDLE) ×1 IMPLANT
NDL HYPO 25X1 1.5 SAFETY (NEEDLE) ×1 IMPLANT
NDL SAFETY ECLIPSE 18X1.5 (NEEDLE) ×1 IMPLANT
NEEDLE FILTER BLUNT 18X1 1/2 (NEEDLE) ×1 IMPLANT
NEEDLE HYPO 25X1 1.5 SAFETY (NEEDLE) ×1 IMPLANT
NS IRRIG 500ML POUR BTL (IV SOLUTION) ×1 IMPLANT
PACK EXTREMITY ARMC (MISCELLANEOUS) ×1 IMPLANT
PACKING GAUZE IODOFORM 1INX5YD (GAUZE/BANDAGES/DRESSINGS) ×1 IMPLANT
PAD ABD DERMACEA PRESS 5X9 (GAUZE/BANDAGES/DRESSINGS) ×2 IMPLANT
PULSAVAC PLUS IRRIG FAN TIP (DISPOSABLE)
RASP SM TEAR CROSS CUT (RASP) IMPLANT
SHIELD FULL FACE ANTIFOG 7M (MISCELLANEOUS) ×1 IMPLANT
SOL PREP PVP 2OZ (MISCELLANEOUS) ×1
SOLUTION PREP PVP 2OZ (MISCELLANEOUS) ×1 IMPLANT
SPONGE T-LAP 18X18 ~~LOC~~+RFID (SPONGE) ×1 IMPLANT
STOCKINETTE IMPERVIOUS 9X36 MD (GAUZE/BANDAGES/DRESSINGS) ×1 IMPLANT
STOCKINETTE M/LG 89821 (MISCELLANEOUS) ×1 IMPLANT
SURGIFLO W/THROMBIN 8M KIT (HEMOSTASIS) IMPLANT
SUT ETHILON 2 0 FS 18 (SUTURE) ×2 IMPLANT
SUT ETHILON 3-0 FS-10 30 BLK (SUTURE) ×1
SUT ETHILON 4-0 FS2 18XMFL BLK (SUTURE)
SUT VIC AB 3-0 SH 27X BRD (SUTURE) ×1 IMPLANT
SUT VIC AB 4-0 FS2 27 (SUTURE) ×1 IMPLANT
SUTURE EHLN 3-0 FS-10 30 BLK (SUTURE) ×1 IMPLANT
SUTURE ETHLN 4-0 FS2 18XMF BLK (SUTURE) ×1 IMPLANT
SWAB CULTURE AMIES ANAERIB BLU (MISCELLANEOUS) IMPLANT
SYR 10ML LL (SYRINGE) ×2 IMPLANT
SYR 3ML LL SCALE MARK (SYRINGE) ×1 IMPLANT
TIP FAN IRRIG PULSAVAC PLUS (DISPOSABLE) ×1 IMPLANT
TRAP FLUID SMOKE EVACUATOR (MISCELLANEOUS) ×1 IMPLANT
WATER STERILE IRR 500ML POUR (IV SOLUTION) ×1 IMPLANT

## 2023-05-02 NOTE — Transfer of Care (Signed)
Immediate Anesthesia Transfer of Care Note  Patient: [name] [name]  Procedure(s) Performed: TRANSMETATARSAL AMPUTATION (Left: Foot) IRRIGATION AND DEBRIDEMENT FOOT (Left: Foot)  Patient Location: PACU  Anesthesia Type:General  Level of Consciousness: awake and drowsy  Airway & Oxygen Therapy: Patient Spontanous Breathing  Post-op Assessment: Report given to RN and Post -op Vital signs reviewed and stable  Post vital signs: Reviewed and stable  Last Vitals:  Vitals Value Taken Time  BP 145/86 05/02/23 1046  Temp 37.1 C 05/02/23 1046  Pulse 78 05/02/23 1049  Resp 19 05/02/23 1049  SpO2 95 % 05/02/23 1049    Last Pain:  Vitals:   05/02/23 0906  TempSrc:   PainSc: 0-No pain         Complications: No notable events documented.

## 2023-05-02 NOTE — Plan of Care (Signed)

## 2023-05-02 NOTE — Op Note (Signed)
Operative note   Surgeon:Kellsie Grindle Armed forces logistics/support/administrative officer: None    Preop diagnosis: Osteomyelitis with abscess left forefoot    Postop diagnosis: Same    Procedure: Transmetatarsal amputation left forefoot  2.  Intraoperative fluoroscopy use without assistance of radiologist    EBL: 30 mL    Anesthesia:local and general.  Local consists of a total of 20 cc of 0.25% bupivacaine infiltrated around the surgical site    Hemostasis: Midcalf tourniquet inflated to 200 mmHg for approximately 20 minutes    Specimen: Culture swab Deep wound culture abscess left forefoot, bone for culture and transmetatarsal amputation for pathology    Complications: None    Operative indications:[name] [name] is an 62 y.o. that presents today for surgical intervention.  The risks/benefits/alternatives/complications have been discussed and consent has been given.    Procedure:  Patient was brought into the OR and placed on the operating table in thesupine position. After anesthesia was obtained theleft lower extremity was prepped and draped in usual sterile fashion.  Attention was directed to the left forefoot where the noted abscessed area was found around the second and third MTPJ.  A small stab incision was made at the abscessed area.  A deep wound culture was performed with a culture swab.  Next full-thickness flaps were created both dorsal and plantar.  The plantar ulceration was incised completely.  Full-thickness flaps were created down to the level of bone.  At the distal one third of the metatarsals osteotomies were created 1 through 5.  The distal tissue was then removed from the surgical field in toto.  This time the proximal margins were marked.  The bone to the third metatarsal head was sampled and sent for bone culture.  The wounds were flushed with copious amounts of irrigation.  At the level of the amputation this looked to be clear of any obvious gross infection.  After copious amounts of irrigation the  tourniquet was released.  I will bleeders were Bovie cauterized.  The area was infiltrated with Surgiflo with topical thrombin.  Closure was performed with a 3-0 Vicryl for the subcutaneous tissue and a 2-0 nylon for the skin.  An area of the dorsal medial flap was left packed with Nu Gauze packing.  A large bulky sterile dressing was applied.    Patient tolerated the procedure and anesthesia well.  Was transported from the OR to the PACU with all vital signs stable and vascular status intact. To be discharged per routine protocol.  Will follow up in approximately 1 week in the outpatient clinic.

## 2023-05-02 NOTE — Anesthesia Postprocedure Evaluation (Signed)
Anesthesia Post Note  Patient: Frak Paolini  Procedure(s) Performed: TRANSMETATARSAL AMPUTATION (Left: Foot) IRRIGATION AND DEBRIDEMENT FOOT (Left: Foot)  Patient location during evaluation: PACU Anesthesia Type: General Level of consciousness: awake and alert Pain management: pain level controlled Vital Signs Assessment: post-procedure vital signs reviewed and stable Respiratory status: spontaneous breathing, nonlabored ventilation, respiratory function stable and patient connected to nasal cannula oxygen Cardiovascular status: blood pressure returned to baseline and stable Postop Assessment: no apparent nausea or vomiting Anesthetic complications: no   No notable events documented.   Last Vitals:  Vitals:   05/02/23 1100 05/02/23 1115  BP: (!) 150/95 (!) 150/92  Pulse: 74 70  Resp: 12 15  Temp:  36.8 C  SpO2: 95% 98%    Last Pain:  Vitals:   05/02/23 1115  TempSrc:   PainSc: 4                  Cleda Mccreedy Yeriel Mineo

## 2023-05-02 NOTE — Progress Notes (Signed)
Pt left floor with Transport to OR prior to AM assessment. This RN to assess pt and obtain VS upon return to floor.

## 2023-05-02 NOTE — Anesthesia Preprocedure Evaluation (Addendum)
Anesthesia Evaluation  Patient identified by MRN, date of birth, ID band Patient awake    Reviewed: Allergy & Precautions, NPO status , Patient's Chart, lab work & pertinent test results  History of Anesthesia Complications Negative for: history of anesthetic complications  Airway Mallampati: III  TM Distance: >3 FB Neck ROM: full    Dental  (+) Chipped, Poor Dentition, Missing, Loose   Pulmonary neg pulmonary ROS, neg shortness of breath   Pulmonary exam normal        Cardiovascular Exercise Tolerance: Good hypertension, (-) angina Normal cardiovascular exam     Neuro/Psych negative neurological ROS  negative psych ROS   GI/Hepatic negative GI ROS, Neg liver ROS,neg GERD  ,,  Endo/Other  diabetes, Type 2    Renal/GU      Musculoskeletal   Abdominal   Peds  Hematology negative hematology ROS (+)   Anesthesia Other Findings Past Medical History: No date: Diabetes mellitus without complication (HCC) No date: Hyperlipidemia No date: Hypertension  Past Surgical History: 07/08/2019: AMPUTATION; Right     Comment:  Procedure: AMPUTATION RAY, PARTIAL RIGHT 1ST;  Surgeon:               Rosetta Posner, DPM;  Location: [facility] ORS;  Service:               Podiatry;  Laterality: Right; No date: NO PAST SURGERIES  BMI    Body Mass Index: 39.60 kg/m      Reproductive/Obstetrics negative OB ROS                             Anesthesia Physical Anesthesia Plan  ASA: 3  Anesthesia Plan: General ETT   Post-op Pain Management:    Induction: Intravenous  PONV Risk Score and Plan: Dexamethasone, Ondansetron, Midazolam and Treatment may vary due to age or medical condition  Airway Management Planned: LMA  Additional Equipment:   Intra-op Plan:   Post-operative Plan: Extubation in OR  Informed Consent: I have reviewed the patients History and Physical, chart, labs and discussed the  procedure including the risks, benefits and alternatives for the proposed anesthesia with the patient or authorized representative who has indicated his/her understanding and acceptance.     Dental Advisory Given  Plan Discussed with: Anesthesiologist, CRNA and Surgeon  Anesthesia Plan Comments: (Patient consented for risks of anesthesia including but not limited to:  - adverse reactions to medications - damage to eyes, teeth, lips or other oral mucosa - nerve damage due to positioning  - sore throat or hoarseness - Damage to heart, brain, nerves, lungs, other parts of body or loss of life  Patient voiced understanding and assent.)       Anesthesia Quick Evaluation

## 2023-05-02 NOTE — Progress Notes (Signed)
Pharmacy Antibiotic Note  [name] [name] is a 62 y.o. male admitted on 04/29/2023 with  Osteomyelitis .  Pharmacy has been consulted for Cefepime and Vancomycin dosing.  Plan: Adjust current vancomycin regimen to vancomycin 1250 mg IV Q12H. Goal AUC 400-550. Expected AUC: 516.8 Expected Css min: 15.0 SCr used: 1.03 Weight used: IBW, Vd used: 0.5 (BMI 39.6) Continue unasyn 3g Q6 hours Continue to monitor renal function and follow culture results   Height: 6' (182.9 cm) Weight: 132.5 kg (292 lb) IBW/kg (Calculated) : 77.6  Temp (24hrs), Avg:97.9 F (36.6 C), Min:97.7 F (36.5 C), Max:98.1 F (36.7 C)  Recent Labs  Lab 04/29/23 1930 04/30/23 0028 04/30/23 0747 05/02/23 0528  WBC 17.5*  --  12.4* 11.5*  CREATININE 1.72*  --  1.46* 1.03  LATICACIDVEN 2.4* 1.9  --   --     Estimated Creatinine Clearance: 106.1 mL/min (by C-G formula based on SCr of 1.03 mg/dL).    No Known Allergies  Antimicrobials this admission: Unasyn 12/12 >> Cefepime 12/10 >> 12/12 Vancomycin 12/10 >>   Dose adjustments this admission:  Vanc 1500 mg IV q24h-->Vanc 1750 mg IV q24h  Microbiology results:   Thank you for allowing pharmacy to be a part of this patient's care.  Bettey Costa, PharmD Clinical Pharmacist 05/02/2023 9:23 AM

## 2023-05-02 NOTE — Plan of Care (Signed)

## 2023-05-02 NOTE — Inpatient Diabetes Management (Signed)
Inpatient Diabetes Program Recommendations  AACE/ADA: New Consensus Statement on Inpatient Glycemic Control (2015)  Target Ranges:  Prepandial:   less than 140 mg/dL      Peak postprandial:   less than 180 mg/dL (1-2 hours)      Critically ill patients:  140 - 180 mg/dL   Lab Results  Component Value Date   GLUCAP 200 (H) 05/02/2023   HGBA1C 9.8 (H) 04/29/2023    Review of Glycemic Control  Latest Reference Range & Units 05/02/23 08:58 05/02/23 10:49 05/02/23 12:02 05/02/23 13:07  Glucose-Capillary 70 - 99 mg/dL 161 (H) 096 (H) 045 (H) 200 (H)  (H): Data is abnormally high  Diabetes history: DM2 Outpatient Diabetes medications:  Metformin 1000 mg BID Victoza 1.8 mg weekly  Jardiance 10 mg QD Current orders for Inpatient glycemic control:  Novolog 0-9 units TID and 0-5 units QHS  Met with patient and spouse at bedside.  Reviewed patient's current A1c of 9.8%. Explained what a A1c is and what it measures. Also reviewed goal A1c with patient, importance of good glucose control @ home, and blood sugar goals.   He does not drink caloric beverages and states he does not eat a lot of carbohydrates.  He is current with [facility] and they assist him with his medications.  He currently has Medicaid pending.  He was recently started on Jardiance.  He does not have a glucometer bit states he will purchase a Reli[employer] meter at [employer].    Discussed importance of good glucose control for optimal healing.  Mentioned he may want to talk to PCP about switching from once daily injection Victoza to once weekly injection Ozempic.  This may have a more positive impact on his glucose.    Ordered the LWWD booklet.    Will continue to follow while inpatient.  Thank you, Dulce Sellar, MSN, CDCES Diabetes Coordinator Inpatient Diabetes Program [phone] (team pager from 8a-5p)

## 2023-05-02 NOTE — Progress Notes (Signed)
PROGRESS NOTE    Marnell Parrill   ZOX:[id number] DOB: 1961/03/26  DOA: 04/29/2023 Date of Service: 05/02/23 which is hospital day 3  PCP: Shane Crutch, PA    HPI: Mr. Anthany Polit is a 62 year old male with history of non-insulin-dependent diabetes mellitus, hypertension, hyperlipidemia, morbid obesity, who presents emergency department for chief concerns of left third toe infection, failing outpatient therapy. L foot started draining for approximately 1-1/2 weeks. PCP prescribed doxycycline 100 mg p.o. twice daily which he started on 04/22/2023. He reports he has 3 more days left of this Rx however it got worse so he decided to come to the ED  Hospital course / significant events:  12/10: admitted to hospitalist service for L foot osteo, tc broad spectrum abx cefepime, vanc, metronidazole. Podiatry to see in AM.  12/11: podiatry recs for I&D with removal of infected necrotic tissue, and likely will need transmetatarsal amputation - OR planned for Friday 12/13 12/12: stable  12/13: Transmetatarsal amputation left forefoot.  Consultants:  Podiatry   Procedures/Surgeries: 05/02/23 Transmetatarsal amputation left forefoot w/ Dr Ether Griffins      ASSESSMENT & PLAN:   Severe Sepsis d/t Foot osteomyelitis, left Meeting sepsis criteria w/ leukocytosis, elevated HR and RR though high HR was transient, elevated lactic acid, AKI  POD0 s/p Transmetatarsal amputation left forefoot  Plan follow w/ podiatry in office in 1 week (12/20) Cefepime, vancomycin, metronidazole Follow blood cultures  Await surgical path/cultures.  PT/OT tomorrow    Diabetes mellitus type 2, noninsulin dependent (HCC) No recent A1c, last 1 was 8.1% in 2021 Check A1c --> above goal Insulin SSI with at bedtime coverage ordered Goal inpatient blood glucose levels 140-180   AKI - resolved Cr on admission 1.72 (baseline 0.9-1.1) --> 1.03 on 12/13 Monitor BMP    Morbid obesity  This meets criteria for morbid  obesity based on the presence of 1 or more chronic comorbidities. Patient has NIDDM2, hypertension and BMI of 39.6.  complicates overall care and prognosis. Weight management outpatient     Hyperlipidemia Simvastatin 40 mg nightly    Hypertension Hydralazine 5 mg IV every 6 hours as needed for SBP greater 175   Morbid ovesity based on BMI: Body mass index is 39.6 kg/m.  Underweight - under 18.5  normal weight - 18.5 to 24.9 overweight - 25 to 29.9 obese - 30 or more   DVT prophylaxis: lovenox  IV fluids: no continuous IV fluids  Nutrition: diabetic diet, plan for NPO at midnight  Central lines / invasive devices: none  Code Status: FULL CODE ACP documentation reviewed:  none on file in [facility]  TOC needs: TBD Barriers to dispo / significant pending items: pending surgery tomorrow              Subjective / Brief ROS:  Patient reports no concerns Denies CP/SOB.  Pain is significant, examined following his surgery, he'd like to defer PT/OT for now  Denies new weakness.   Family Communication: family at bedside on rounds     Objective Findings:  Vitals:   05/02/23 1100 05/02/23 1115 05/02/23 1159 05/02/23 1310  BP: (!) 150/95 (!) 150/92 (!) 140/83 113/67  Pulse: 74 70 76 81  Resp: 12 15 17 18   Temp:  98.3 F (36.8 C)  97.7 F (36.5 C)  TempSrc:    Oral  SpO2: 95% 98% 93% 96%  Weight:      Height:        Intake/Output Summary (Last 24 hours) at 05/02/2023 1429 Last data  filed at 05/02/2023 1039 Gross per 24 hour  Intake 1655 ml  Output 725 ml  Net 930 ml    Filed Weights   04/29/23 1928  Weight: 132.5 kg    Examination:  Physical Exam Constitutional:      Appearance: Normal appearance. He is obese.  Cardiovascular:     Rate and Rhythm: Normal rate and regular rhythm.  Pulmonary:     Effort: Pulmonary effort is normal.     Breath sounds: Normal breath sounds.  Abdominal:     Palpations: Abdomen is soft.  Skin:    General: Skin is  warm and dry.  Neurological:     General: No focal deficit present.     Mental Status: He is alert and oriented to person, place, and time.  Psychiatric:        Mood and Affect: Mood normal.        Behavior: Behavior normal.          Scheduled Medications:   enoxaparin (LOVENOX) injection  60 mg Subcutaneous Q24H   hydrocortisone cream   Topical TID   insulin aspart  0-5 Units Subcutaneous QHS   insulin aspart  0-9 Units Subcutaneous TID WC   living well with diabetes book   Does not apply Once   simvastatin  40 mg Oral QHS    Continuous Infusions:  ampicillin-sulbactam (UNASYN) IV Stopped (05/02/23 1151)   vancomycin      PRN Medications:  acetaminophen **OR** acetaminophen, hydrALAZINE, morphine injection, ondansetron **OR** ondansetron (ZOFRAN) IV, oxyCODONE, senna-docusate  Antimicrobials from admission:  Anti-infectives (From admission, onward)    Start     Dose/Rate Route Frequency Ordered Stop   05/02/23 1600  vancomycin (VANCOREADY) IVPB 1250 mg/250 mL        1,250 mg 166.7 mL/hr over 90 Minutes Intravenous Every 12 hours 05/02/23 1345     05/01/23 2200  vancomycin (VANCOREADY) IVPB 1750 mg/350 mL  Status:  Discontinued        1,750 mg 175 mL/hr over 120 Minutes Intravenous Every 24 hours 05/01/23 0944 05/01/23 1706   05/01/23 2200  vancomycin (VANCOCIN) 1,750 mg in sodium chloride 0.9 % 500 mL IVPB  Status:  Discontinued        1,750 mg 258.8 mL/hr over 120 Minutes Intravenous Every 24 hours 05/01/23 1707 05/02/23 1345   05/01/23 1200  Ampicillin-Sulbactam (UNASYN) 3 g in sodium chloride 0.9 % 100 mL IVPB        3 g 200 mL/hr over 30 Minutes Intravenous Every 6 hours 05/01/23 1001     04/30/23 2200  Vancomycin (VANCOCIN) 1,500 mg in sodium chloride 0.9 % 500 mL IVPB  Status:  Discontinued        1,500 mg 250 mL/hr over 120 Minutes Intravenous Every 24 hours 04/29/23 2156 05/01/23 0944   04/30/23 0600  ceFEPIme (MAXIPIME) 2 g in sodium chloride 0.9 % 100 mL  IVPB  Status:  Discontinued        2 g 200 mL/hr over 30 Minutes Intravenous Every 8 hours 04/29/23 2156 05/01/23 1001   04/29/23 2200  metroNIDAZOLE (FLAGYL) IVPB 500 mg  Status:  Discontinued       Note to Pharmacy: His left toe is terrible looking   500 mg 100 mL/hr over 60 Minutes Intravenous Every 12 hours 04/29/23 2145 05/01/23 1001   04/29/23 2200  vancomycin (VANCOREADY) IVPB 2000 mg/400 mL        2,000 mg 200 mL/hr over 120 Minutes Intravenous  Once 04/29/23  2156 04/30/23 0301   04/29/23 2130  metroNIDAZOLE (FLAGYL) IVPB 500 mg  Status:  Discontinued        500 mg 100 mL/hr over 60 Minutes Intravenous Every 12 hours 04/29/23 2117 04/29/23 2145   04/29/23 2130  ceFEPIme (MAXIPIME) 2 g in sodium chloride 0.9 % 100 mL IVPB        2 g 200 mL/hr over 30 Minutes Intravenous  Once 04/29/23 2120 04/29/23 2203           Data Reviewed:  I have personally reviewed the following...  CBC: Recent Labs  Lab 04/29/23 1930 04/30/23 0747 05/02/23 0528  WBC 17.5* 12.4* 11.5*  NEUTROABS 12.5*  --   --   HGB 13.6 12.1* 12.6*  HCT 41.3 37.2* 38.0*  MCV 91.6 92.5 88.8  PLT 420* 329 387   Basic Metabolic Panel: Recent Labs  Lab 04/29/23 1930 04/30/23 0747 05/02/23 0528  NA 135 137 136  K 3.9 3.7 4.1  CL 96* 102 102  CO2 27 23 23   GLUCOSE 291* 223* 206*  BUN 34* 32* 20  CREATININE 1.72* 1.46* 1.03  CALCIUM 9.1 8.5* 8.9   GFR: Estimated Creatinine Clearance: 106.1 mL/min (by C-G formula based on SCr of 1.03 mg/dL). Liver Function Tests: No results for input(s): "AST", "ALT", "ALKPHOS", "BILITOT", "PROT", "ALBUMIN" in the last 168 hours. No results for input(s): "LIPASE", "AMYLASE" in the last 168 hours. No results for input(s): "AMMONIA" in the last 168 hours. Coagulation Profile: No results for input(s): "INR", "PROTIME" in the last 168 hours. Cardiac Enzymes: No results for input(s): "CKTOTAL", "CKMB", "CKMBINDEX", "TROPONINI" in the last 168 hours. BNP (last 3  results) No results for input(s): "PROBNP" in the last 8760 hours. HbA1C: Recent Labs    04/29/23 1930  HGBA1C 9.8*   CBG: Recent Labs  Lab 05/02/23 0819 05/02/23 0858 05/02/23 1049 05/02/23 1202 05/02/23 1307  GLUCAP 176* 186* 183* 181* 200*   Lipid Profile: No results for input(s): "CHOL", "HDL", "LDLCALC", "TRIG", "CHOLHDL", "LDLDIRECT" in the last 72 hours. Thyroid Function Tests: No results for input(s): "TSH", "T4TOTAL", "FREET4", "T3FREE", "THYROIDAB" in the last 72 hours. Anemia Panel: No results for input(s): "VITAMINB12", "FOLATE", "FERRITIN", "TIBC", "IRON", "RETICCTPCT" in the last 72 hours. Most Recent Urinalysis On File:  No results found for: "COLORURINE", "APPEARANCEUR", "LABSPEC", "PHURINE", "GLUCOSEU", "HGBUR", "BILIRUBINUR", "KETONESUR", "PROTEINUR", "UROBILINOGEN", "NITRITE", "LEUKOCYTESUR" Sepsis Labs: @LABRCNTIP (procalcitonin:4,lacticidven:4) Microbiology: Recent Results (from the past 240 hours)  Blood Cultures x 2 sites     Status: None (Preliminary result)   Collection Time: 04/30/23 12:28 AM   Specimen: BLOOD LEFT ARM  Result Value Ref Range Status   Specimen Description BLOOD LEFT ARM  Final   Special Requests   Final    BOTTLES DRAWN AEROBIC AND ANAEROBIC Blood Culture results may not be optimal due to an inadequate volume of blood received in culture bottles   Culture   Final    NO GROWTH 2 DAYS Performed at [facility], [address] [place], [place] [address]    Report Status PENDING  Incomplete  Blood Cultures x 2 sites     Status: None (Preliminary result)   Collection Time: 04/30/23 12:28 AM   Specimen: BLOOD LEFT ARM  Result Value Ref Range Status   Specimen Description BLOOD LEFT ARM  Final   Special Requests   Final    BOTTLES DRAWN AEROBIC AND ANAEROBIC Blood Culture results may not be optimal due to an inadequate volume of blood received in culture bottles  Culture   Final    NO GROWTH 2 DAYS Performed at [facility], [address] [place], [place] [address]    Report Status PENDING  Incomplete  Surgical PCR screen     Status: None   Collection Time: 05/02/23  1:00 AM   Specimen: Nasal Mucosa; Nasal Swab  Result Value Ref Range Status   MRSA, PCR NEGATIVE NEGATIVE Final   Staphylococcus aureus NEGATIVE NEGATIVE Final    Comment: (NOTE) The Xpert SA Assay (FDA approved for NASAL specimens in patients 4 years of age and older), is one component of a comprehensive surveillance program. It is not intended to diagnose infection nor to guide or monitor treatment. Performed at [facility], [address] [place], [place] [address]       Radiology Studies last 3 days: DG MINI C-ARM IMAGE ONLY Result Date: 05/02/2023 There is no interpretation for this exam.  This order is for images obtained during a surgical procedure.  Please See "Surgeries" Tab for more information regarding the procedure.   US ARTERIAL ABI (SCREENING LOWER EXTREMITY) Result Date: 04/30/2023 CLINICAL DATA:  Osteomyelitis of the left foot Hypertension Hyperlipidemia Diabetes EXAM: NONINVASIVE PHYSIOLOGIC VASCULAR STUDY OF BILATERAL LOWER EXTREMITIES TECHNIQUE: Evaluation of both lower extremities were performed at rest, including calculation of ankle-brachial indices with single level pressure measurements and doppler recording. COMPARISON:  None available. FINDINGS: Right ABI:  1.38 Left ABI:  1.29 Right Lower Extremity:  Normal arterial waveforms at the ankle. Left Lower Extremity:  Normal arterial waveforms at the ankle. 1.0-1.4 Normal IMPRESSION: Normal ankle-brachial indices. Electronically Signed   By: Acquanetta Belling M.D.   On: 04/30/2023 11:57   MR FOOT LEFT WO CONTRAST Result Date: 04/30/2023 CLINICAL DATA:  Diabetic foot infection EXAM: MRI OF THE LEFT FOOT WITHOUT CONTRAST TECHNIQUE: Multiplanar, multisequence MR imaging of the left forefoot was performed. No intravenous contrast was administered.  COMPARISON:  X-ray 04/29/2023 FINDINGS: Bones/Joint/Cartilage Findings of acute osteomyelitis involving the proximal, middle, and distal phalanx of the third toe as well as the third metatarsal head and diaphysis. Intense bone marrow edema within these bones with confluent low T1 marrow signal changes. Large complex third MTP joint effusion is compatible with septic arthritis. Milder bone marrow edema within the fourth toe proximal phalanx, fourth metatarsal head as well as within the bones of the second toe, which may represent reactive osteitis or early osteomyelitis. The third MTP joint is dorsally dislocated. The great toe IP joint is in extension. No fractures. Degenerative changes are most pronounced at the second MTP joint. Ligaments Intact Lisfranc ligament.  No acute collateral ligament injury. Muscles and Tendons Denervation changes of the foot musculature. The third toe flexor tendon is ill-defined at the level of the soft tissue ulceration and may be torn. Mild tenosynovitis. Soft tissues Soft tissue ulceration at the plantar aspect of the forefoot underlying the third MTP joint. Large complex fluid collection at the plantar aspect of the third toe and extending between the second and third toes measuring 3.6 x 2.6 x 3.3 cm. Prominent subcutaneous edema throughout the dorsum of the foot. IMPRESSION: 1. Soft tissue ulceration at the plantar aspect of the forefoot underlying the third MTP joint. Large complex fluid collection at the plantar aspect of the third toe and extending between the second and third toes measuring 3.6 x 2.6 x 3.3 cm, compatible with abscess. 2. Findings of acute osteomyelitis involving the proximal, middle, and distal phalanx of the third toe as well as  the third metatarsal head and diaphysis. Large complex third MTP joint effusion is compatible with septic arthritis. The third MTP joint is dorsally dislocated. 3. Milder bone marrow edema within the fourth toe proximal phalanx,  fourth metatarsal head as well as within the bones of the second toe, which may represent reactive osteitis or early osteomyelitis. 4. The third toe flexor tendon is ill-defined at the level of the soft tissue ulceration and may be torn. Mild tenosynovitis. Electronically Signed   By: Duanne Guess D.O.   On: 04/30/2023 08:28   DG Foot Complete Left Result Date: 04/29/2023 CLINICAL DATA:  Worsening left foot infection, third digit erythema EXAM: LEFT FOOT - COMPLETE 3+ VIEW COMPARISON:  07/07/2019 FINDINGS: Frontal, oblique, and lateral views of the left foot are obtained. There is diffuse soft tissue edema, greatest in the forefoot and first through third digits. Erosive changes are seen at the base of third proximal phalanx and head of the third metatarsal consistent with acute osteomyelitis. Evidence of chronic osteomyelitis at the first interphalangeal joint and second metatarsophalangeal joints. No acute fractures. Mild midfoot osteoarthritis. IMPRESSION: 1. Diffuse soft tissue edema consistent with cellulitis. Erosive changes at the base of the third proximal phalanx and head of the third metatarsal consistent with acute osteomyelitis. 2. Sequela of chronic osteomyelitis at the first interphalangeal and second metatarsophalangeal joints. 3. Osteoarthritis. Electronically Signed   By: Sharlet Salina M.D.   On: 04/29/2023 20:20        Sunnie Nielsen, DO [facility] 05/02/2023, 2:29 PM    Dictation software may have been used to generate the above note. Typos may occur and escape review in typed/dictated notes. Please contact Dr Lyn Hollingshead directly for clarity if needed.  Staff may message me via secure chat in Epic  but this may not receive an immediate response,  please page me for urgent matters!  If 7PM-7AM, please contact night coverage [email or web address]

## 2023-05-03 ENCOUNTER — Encounter: Payer: Self-pay | Admitting: Podiatry

## 2023-05-03 DIAGNOSIS — M86171 Other acute osteomyelitis, right ankle and foot: Secondary | ICD-10-CM

## 2023-05-03 LAB — GLUCOSE, CAPILLARY
Glucose-Capillary: 164 mg/dL — ABNORMAL HIGH (ref 70–99)
Glucose-Capillary: 242 mg/dL — ABNORMAL HIGH (ref 70–99)

## 2023-05-03 MED ORDER — OXYCODONE HCL 5 MG PO TABS: 5.0000 mg | ORAL_TABLET | Freq: Four times a day (QID) | ORAL | 0 refills | Status: DC | PRN

## 2023-05-03 MED ORDER — CIPROFLOXACIN HCL 500 MG PO TABS: 500.0000 mg | ORAL_TABLET | Freq: Two times a day (BID) | ORAL | 0 refills | Status: AC

## 2023-05-03 MED ORDER — AMOXICILLIN-POT CLAVULANATE 875-125 MG PO TABS: 1.0000 | ORAL_TABLET | Freq: Two times a day (BID) | ORAL | 0 refills | Status: AC

## 2023-05-03 NOTE — TOC Initial Note (Signed)
Transition of Care [facility]) - Initial/Assessment Note    Patient Details  Name: [name] [name] MRN: [id number] Date of Birth: Apr 23, 1961  [facility]) CM/SW Contact:    Rodney Langton, RN Phone Number: 05/03/2023, 3:22 PM  Clinical Narrative:                  Patient admitted from home with wife, had transmetatarsal amputation yesterday.  Shane Crutch, NP is PCP, obtains medications from P[facility] as [facility].  Does not have DME in the home.  He is advised of recommendations for HHPT, agreeable, but confirms he does not have insurance, which will be a barrier.  Discussed use of outpatient facility if unable to secure home health team, he remains reluctant to agree to this, would like to think about it and have follow up tomorrow.    Expected Discharge Plan: Home w Home Health Services Barriers to Discharge: Continued Medical Work up   Patient Goals and CMS Choice            Expected Discharge Plan and Services       Living arrangements for the past 2 months: Single Family Home                                      Prior Living Arrangements/Services Living arrangements for the past 2 months: Single Family Home Lives with:: Spouse                   Activities of Daily Living   ADL Screening (condition at time of admission) Independently performs ADLs?: Yes (appropriate for developmental age) Is the patient deaf or have difficulty hearing?: No Does the patient have difficulty seeing, even when wearing glasses/contacts?: No Does the patient have difficulty concentrating, remembering, or making decisions?: No  Permission Sought/Granted                  Emotional Assessment              Admission diagnosis:  Foot osteomyelitis, left (HCC) [M86.9] Osteomyelitis of left foot, unspecified type (HCC) [M86.9] Sepsis, due to unspecified organism, unspecified whether acute organ dysfunction present [facility]) [A41.9] Patient Active Problem  List   Diagnosis Date Noted   Foot osteomyelitis, left (HCC) 04/29/2023   Severe sepsis (HCC) 04/29/2023   Diabetes mellitus type 2, noninsulin dependent (HCC) 04/29/2023   Morbid obesity (HCC) 04/29/2023   Cellulitis of great toe of right foot 07/06/2019   Obesity, Class I, BMI 30-34.9 08/25/2015   Hyperlipidemia 05/30/2015   Hypertension 05/26/2015   Erectile dysfunction 05/26/2015   PCP:  Shane Crutch, PA Pharmacy:   [facility] [address]  - [address] [place] [address] Phone: [phone] Fax: [phone]     Social Drivers of Health (SDOH) Social History: SDOH Screenings   Food Insecurity: No Food Insecurity (05/02/2023)  Housing: Low Risk  (05/02/2023)  Transportation Needs: No Transportation Needs (05/02/2023)  Utilities: Not At Risk (05/02/2023)  Physical Activity: Insufficiently Active (02/06/2018)   Received from [facility]  Social Connections: Somewhat Isolated (02/06/2018)   Received from [facility]  Stress: No Stress Concern Present (02/06/2018)   Received from [facility]  Tobacco Use: Low Risk  (04/29/2023)  Health Literacy: Low Risk  (08/28/2020)   Received from [facility]   SDOH Interventions:     Readmission  Risk Interventions     No data to display

## 2023-05-03 NOTE — Discharge Instructions (Signed)
Dressing care: Keep dressing clean dry and intact.  Can change dressing every 3 days.  Apply dry gauze dressing to incision site.  Remain nonweightbearing to your left foot at all times.  Avoid any pressure to the foot at all.

## 2023-05-03 NOTE — Plan of Care (Signed)
  Problem: Fluid Volume: Goal: Hemodynamic stability will improve Outcome: Progressing   Problem: Clinical Measurements: Goal: Diagnostic test results will improve Outcome: Progressing Goal: Signs and symptoms of infection will decrease Outcome: Progressing   Problem: Respiratory: Goal: Ability to maintain adequate ventilation will improve Outcome: Progressing   Problem: Coping: Goal: Ability to adjust to condition or change in health will improve Outcome: Progressing   Problem: Fluid Volume: Goal: Ability to maintain a balanced intake and output will improve Outcome: Progressing   Problem: Metabolic: Goal: Ability to maintain appropriate glucose levels will improve Outcome: Progressing   Problem: Nutritional: Goal: Maintenance of adequate nutrition will improve Outcome: Progressing Goal: Progress toward achieving an optimal weight will improve Outcome: Progressing   Problem: Skin Integrity: Goal: Risk for impaired skin integrity will decrease Outcome: Progressing   Problem: Tissue Perfusion: Goal: Adequacy of tissue perfusion will improve Outcome: Progressing   Problem: Education: Goal: Knowledge of General Education information will improve Description: Including pain rating scale, medication(s)/side effects and non-pharmacologic comfort measures Outcome: Progressing   Problem: Clinical Measurements: Goal: Ability to maintain clinical measurements within normal limits will improve Outcome: Progressing Goal: Will remain free from infection Outcome: Progressing Goal: Diagnostic test results will improve Outcome: Progressing Goal: Respiratory complications will improve Outcome: Progressing Goal: Cardiovascular complication will be avoided Outcome: Progressing   Problem: Activity: Goal: Risk for activity intolerance will decrease Outcome: Progressing   Problem: Nutrition: Goal: Adequate nutrition will be maintained Outcome: Progressing   Problem:  Coping: Goal: Level of anxiety will decrease Outcome: Progressing   Problem: Elimination: Goal: Will not experience complications related to bowel motility Outcome: Progressing Goal: Will not experience complications related to urinary retention Outcome: Progressing   Problem: Pain Management: Goal: General experience of comfort will improve Outcome: Progressing   Problem: Safety: Goal: Ability to remain free from injury will improve Outcome: Progressing   Problem: Skin Integrity: Goal: Risk for impaired skin integrity will decrease Outcome: Progressing

## 2023-05-03 NOTE — Discharge Summary (Signed)
Physician Discharge Summary   Patient: [name] [name] MRN: [id number]  DOB: Apr 20, 1961   Admit:     Date of Admission: 04/29/2023 Admitted from: home   Discharge: Date of discharge: 05/03/23 Disposition: Home Condition at discharge: good  CODE STATUS: FULL CODE     Discharge Physician: Sunnie Nielsen, DO [facility]     PCP: Shane Crutch, PA  Recommendations for Outpatient Follow-up:  Follow up with PCP Shane Crutch, PA in 2-4 weeks Follow up with podiatry Dr Ether Griffins this week around [facility] 12/19 Please follow up on the following pending results: cultures and surgical pathology    Discharge Instructions     Call MD for:  redness, tenderness, or signs of infection (pain, swelling, redness, odor or green/yellow discharge around incision site)   Complete by: As directed    Diet - low sodium heart healthy   Complete by: As directed    Discharge wound care:   Complete by: As directed    Change dressing every other day   Increase activity slowly   Complete by: As directed          Discharge Diagnoses: Principal Problem:   Foot osteomyelitis, left (HCC) Active Problems:   Severe sepsis (HCC)   Diabetes mellitus type 2, noninsulin dependent (HCC)   Hypertension   Erectile dysfunction   Hyperlipidemia   Morbid obesity (HCC)       HPI: [name] is a 62 year old male with history of non-insulin-dependent diabetes mellitus, hypertension, hyperlipidemia, morbid obesity, who presents emergency department for chief concerns of left third toe infection, failing outpatient therapy. L foot started draining for approximately 1-1/2 weeks. PCP prescribed doxycycline 100 mg p.o. twice daily which he started on 04/22/2023. He reports he has 3 more days left of this Rx however it got worse so he decided to come to the ED  Hospital course / significant events:  12/10: admitted to hospitalist service for L foot osteo, tc broad spectrum abx cefepime,  vanc, metronidazole. Podiatry to see in AM.  12/11: podiatry recs for I&D with removal of infected necrotic tissue, and likely will need transmetatarsal amputation - OR planned for Friday 12/13 12/12: stable  12/13: Transmetatarsal amputation left forefoot. 12/14: stable postop, ok to go home w/ po abx and podiatry to follow culture/path results  Consultants:  Podiatry   Procedures/Surgeries: 05/02/23 Transmetatarsal amputation left forefoot w/ Dr Ether Griffins      ASSESSMENT & PLAN:   Severe Sepsis d/t Foot osteomyelitis, left Meeting sepsis criteria w/ leukocytosis, elevated HR and RR though high HR was transient, elevated lactic acid, AKI  Plan follow w/ podiatry in office as above Augmentin and Cipro on discharge  Await surgical path/cultures. --> podiatry to follow    Diabetes mellitus type 2, noninsulin dependent (HCC) No recent A1c, last 1 was 8.1% in 2021 Check A1c --> above goal Resume home meds Outpatient follow up   AKI - resolved Cr on admission 1.72 (baseline 0.9-1.1) --> 1.03 on 12/13 Monitor BMP outpatient    Morbid obesity  This meets criteria for morbid obesity based on the presence of 1 or more chronic comorbidities. Patient has NIDDM2, hypertension and BMI of 39.6.  complicates overall care and prognosis. Weight management outpatient     Hyperlipidemia atorvastatin     Hypertension Resume home meds    Obesity based on BMI: Body mass index is 39.6 kg/m.  Underweight - under 18.5  normal weight - 18.5 to 24.9 overweight - 25 to 29.9 obese -  30 or more              Discharge Instructions  Allergies as of 05/03/2023   No Known Allergies      Medication List     STOP taking these medications    doxycycline 100 MG tablet Commonly known as: VIBRA-TABS   simvastatin 40 MG tablet Commonly known as: ZOCOR       TAKE these medications    amoxicillin-clavulanate 875-125 MG tablet Commonly known as: AUGMENTIN Take 1 tablet by  mouth 2 (two) times daily for 7 days.   aspirin 81 MG tablet Take 1 tablet (81 mg total) by mouth daily.   atorvastatin 40 MG tablet Commonly known as: LIPITOR Take 40 mg by mouth daily.   b complex vitamins tablet Take 1 tablet by mouth daily.   ciprofloxacin 500 MG tablet Commonly known as: CIPRO Take 1 tablet (500 mg total) by mouth 2 (two) times daily for 7 days.   Jardiance 25 MG Tabs tablet Generic drug: empagliflozin Take 25 mg by mouth daily.   lisinopril 10 MG tablet Commonly known as: ZESTRIL Take 1 tablet (10 mg total) by mouth daily. What changed: Another medication with the same name was removed. Continue taking this medication, and follow the directions you see here.   metFORMIN 1000 MG tablet Commonly known as: GLUCOPHAGE Take 1 tablet (1,000 mg total) by mouth 2 (two) times daily with a meal.   oxyCODONE 5 MG immediate release tablet Commonly known as: Oxy IR/ROXICODONE Take 1-2 tablets (5-10 mg total) by mouth every 6 (six) hours as needed for up to 5 days for moderate pain (pain score 4-6) or severe pain (pain score 7-10).   Victoza 18 MG/3ML Sopn Generic drug: liraglutide Inject 1.2 mg into the skin daily.               Discharge Care Instructions  (From admission, onward)           Start     Ordered   05/03/23 0000  Discharge wound care:       Comments: Change dressing every other day   05/03/23 1528             Follow-up Information     Gwyneth Revels, DPM Follow up in 1 week(s).   Specialty: Podiatry Why: Patient to call on Monday for follow-up on approximately Thursday of this week. Contact information: [address] [place] [address] [phone]                 No Known Allergies   Subjective: Pt reports feeling well, no concerns, would like to go home, will be staying with family. No fever/chills, no CP/SOB, pain is controlled    Discharge Exam: BP 127/74 (BP Location: Left Arm)   Pulse 80    Temp (!) 97.3 F (36.3 C)   Resp 18   Ht 6' (1.829 m)   Wt 132.5 kg   SpO2 96%   BMI 39.60 kg/m  General: Pt is alert, awake, not in acute distress Cardiovascular: RRR, S1/S2 +, no rubs, no gallops Respiratory: CTA bilaterally, no wheezing, no rhonchi Abdominal: Soft, NT, ND, bowel sounds + Extremities: no edema, no cyanosis. RLE bandage not removed, was examined this morning by podiatry. No erythema or tenderness proximal to bandage      The results of significant diagnostics from this hospitalization (including imaging, microbiology, ancillary and laboratory) are listed below for reference.     Microbiology: Recent Results (from  the past 240 hours)  Blood Cultures x 2 sites     Status: None (Preliminary result)   Collection Time: 04/30/23 12:28 AM   Specimen: BLOOD LEFT ARM  Result Value Ref Range Status   Specimen Description BLOOD LEFT ARM  Final   Special Requests   Final    BOTTLES DRAWN AEROBIC AND ANAEROBIC Blood Culture results may not be optimal due to an inadequate volume of blood received in culture bottles   Culture   Final    NO GROWTH 3 DAYS Performed at [facility], [address], [place], [place] [address]    Report Status PENDING  Incomplete  Blood Cultures x 2 sites     Status: None (Preliminary result)   Collection Time: 04/30/23 12:28 AM   Specimen: BLOOD LEFT ARM  Result Value Ref Range Status   Specimen Description BLOOD LEFT ARM  Final   Special Requests   Final    BOTTLES DRAWN AEROBIC AND ANAEROBIC Blood Culture results may not be optimal due to an inadequate volume of blood received in culture bottles   Culture   Final    NO GROWTH 3 DAYS Performed at [facility], [address] [place], [place] [address]    Report Status PENDING  Incomplete  Surgical PCR screen     Status: None   Collection Time: 05/02/23  1:00 AM   Specimen: Nasal Mucosa; Nasal Swab  Result Value Ref Range Status   MRSA, PCR NEGATIVE NEGATIVE  Final   Staphylococcus aureus NEGATIVE NEGATIVE Final    Comment: (NOTE) The Xpert SA Assay (FDA approved for NASAL specimens in patients 54 years of age and older), is one component of a comprehensive surveillance program. It is not intended to diagnose infection nor to guide or monitor treatment. Performed at [facility], [address] [place], [place] [address]   Aerobic/Anaerobic Culture w Gram Stain (surgical/deep wound)     Status: None (Preliminary result)   Collection Time: 05/02/23  9:52 AM   Specimen: Path fluid; Tissue  Result Value Ref Range Status   Specimen Description   Final    FLUID Performed at [facility], [address] [place], [place] [address]    Special Requests   Final    NONE Performed at [facility], [address] [place], [place] [address]    Gram Stain   Final    ABUNDANT WBC PRESENT, PREDOMINANTLY PMN RARE GRAM POSITIVE COCCI IN PAIRS    Culture   Final    CULTURE REINCUBATED FOR BETTER GROWTH Performed at [facility], [address], [place], [place] [address]    Report Status PENDING  Incomplete  Aerobic/Anaerobic Culture w Gram Stain (surgical/deep wound)     Status: None (Preliminary result)   Collection Time: 05/02/23 10:00 AM   Specimen: PATH Bone resection; Tissue  Result Value Ref Range Status   Specimen Description   Final    BONE Performed at [facility], [address], [place], [place] [address]    Special Requests   Final    NONE Performed at [facility], [address] [place], [place] [address]    Gram Stain   Final    ABUNDANT WBC PRESENT, PREDOMINANTLY PMN RARE GRAM POSITIVE COCCI IN PAIRS RARE GRAM VARIABLE ROD    Culture   Final    CULTURE REINCUBATED FOR BETTER GROWTH Performed at [facility], [address], [place], [place] [address]  Report Status PENDING  Incomplete     Labs: BNP (last 3 results) No results for input(s): "BNP" in  the last 8760 hours. Basic Metabolic Panel: Recent Labs  Lab 04/29/23 1930 04/30/23 0747 05/02/23 0528  NA 135 137 136  K 3.9 3.7 4.1  CL 96* 102 102  CO2 27 23 23   GLUCOSE 291* 223* 206*  BUN 34* 32* 20  CREATININE 1.72* 1.46* 1.03  CALCIUM 9.1 8.5* 8.9   Liver Function Tests: No results for input(s): "AST", "ALT", "ALKPHOS", "BILITOT", "PROT", "ALBUMIN" in the last 168 hours. No results for input(s): "LIPASE", "AMYLASE" in the last 168 hours. No results for input(s): "AMMONIA" in the last 168 hours. CBC: Recent Labs  Lab 04/29/23 1930 04/30/23 0747 05/02/23 0528  WBC 17.5* 12.4* 11.5*  NEUTROABS 12.5*  --   --   HGB 13.6 12.1* 12.6*  HCT 41.3 37.2* 38.0*  MCV 91.6 92.5 88.8  PLT 420* 329 387   Cardiac Enzymes: No results for input(s): "CKTOTAL", "CKMB", "CKMBINDEX", "TROPONINI" in the last 168 hours. BNP: Invalid input(s): "POCBNP" CBG: Recent Labs  Lab 05/02/23 1307 05/02/23 1803 05/02/23 2216 05/03/23 0754 05/03/23 1116  GLUCAP 200* 201* 164* 164* 242*   D-Dimer No results for input(s): "DDIMER" in the last 72 hours. Hgb A1c No results for input(s): "HGBA1C" in the last 72 hours. Lipid Profile No results for input(s): "CHOL", "HDL", "LDLCALC", "TRIG", "CHOLHDL", "LDLDIRECT" in the last 72 hours. Thyroid function studies No results for input(s): "TSH", "T4TOTAL", "T3FREE", "THYROIDAB" in the last 72 hours.  Invalid input(s): "FREET3" Anemia work up No results for input(s): "VITAMINB12", "FOLATE", "FERRITIN", "TIBC", "IRON", "RETICCTPCT" in the last 72 hours. Urinalysis No results found for: "COLORURINE", "APPEARANCEUR", "LABSPEC", "PHURINE", "GLUCOSEU", "HGBUR", "BILIRUBINUR", "KETONESUR", "PROTEINUR", "UROBILINOGEN", "NITRITE", "LEUKOCYTESUR" Sepsis Labs Recent Labs  Lab 04/29/23 1930 04/30/23 0747 05/02/23 0528  WBC 17.5* 12.4* 11.5*   Microbiology Recent Results (from the past 240 hours)  Blood Cultures x 2 sites     Status: None  (Preliminary result)   Collection Time: 04/30/23 12:28 AM   Specimen: BLOOD LEFT ARM  Result Value Ref Range Status   Specimen Description BLOOD LEFT ARM  Final   Special Requests   Final    BOTTLES DRAWN AEROBIC AND ANAEROBIC Blood Culture results may not be optimal due to an inadequate volume of blood received in culture bottles   Culture   Final    NO GROWTH 3 DAYS Performed at [facility], [address] [place], [place] [address]    Report Status PENDING  Incomplete  Blood Cultures x 2 sites     Status: None (Preliminary result)   Collection Time: 04/30/23 12:28 AM   Specimen: BLOOD LEFT ARM  Result Value Ref Range Status   Specimen Description BLOOD LEFT ARM  Final   Special Requests   Final    BOTTLES DRAWN AEROBIC AND ANAEROBIC Blood Culture results may not be optimal due to an inadequate volume of blood received in culture bottles   Culture   Final    NO GROWTH 3 DAYS Performed at [facility], [address], [place], [place] [address]    Report Status PENDING  Incomplete  Surgical PCR screen     Status: None   Collection Time: 05/02/23  1:00 AM   Specimen: Nasal Mucosa; Nasal Swab  Result Value Ref Range Status   MRSA, PCR NEGATIVE NEGATIVE Final   Staphylococcus aureus NEGATIVE NEGATIVE Final    Comment: (NOTE) The Xpert  SA Assay (FDA approved for NASAL specimens in patients 53 years of age and older), is one component of a comprehensive surveillance program. It is not intended to diagnose infection nor to guide or monitor treatment. Performed at [facility] [place] [facility], [address] [place], [place] [address]   Aerobic/Anaerobic Culture w Gram Stain (surgical/deep wound)     Status: None (Preliminary result)   Collection Time: 05/02/23  9:52 AM   Specimen: Path fluid; Tissue  Result Value Ref Range Status   Specimen Description   Final    FLUID Performed at [facility], [address], [place], [place] [address]    Special  Requests   Final    NONE Performed at [facility], [address] [place], [place] [address]    Gram Stain   Final    ABUNDANT WBC PRESENT, PREDOMINANTLY PMN RARE GRAM POSITIVE COCCI IN PAIRS    Culture   Final    CULTURE REINCUBATED FOR BETTER GROWTH Performed at [facility], [address] [place], [place] [address]    Report Status PENDING  Incomplete  Aerobic/Anaerobic Culture w Gram Stain (surgical/deep wound)     Status: None (Preliminary result)   Collection Time: 05/02/23 10:00 AM   Specimen: PATH Bone resection; Tissue  Result Value Ref Range Status   Specimen Description   Final    BONE Performed at [facility], [address] [place], [place] [address]    Special Requests   Final    NONE Performed at [facility], [address] [place], [place] [address]    Gram Stain   Final    ABUNDANT WBC PRESENT, PREDOMINANTLY PMN RARE GRAM POSITIVE COCCI IN PAIRS RARE GRAM VARIABLE ROD    Culture   Final    CULTURE REINCUBATED FOR BETTER GROWTH Performed at [facility], [address] [place], [place] [address]    Report Status PENDING  Incomplete   Imaging US ARTERIAL ABI (SCREENING LOWER EXTREMITY) Result Date: 04/30/2023 CLINICAL DATA:  Osteomyelitis of the left foot Hypertension Hyperlipidemia Diabetes EXAM: NONINVASIVE PHYSIOLOGIC VASCULAR STUDY OF BILATERAL LOWER EXTREMITIES TECHNIQUE: Evaluation of both lower extremities were performed at rest, including calculation of ankle-brachial indices with single level pressure measurements and doppler recording. COMPARISON:  None available. FINDINGS: Right ABI:  1.38 Left ABI:  1.29 Right Lower Extremity:  Normal arterial waveforms at the ankle. Left Lower Extremity:  Normal arterial waveforms at the ankle. 1.0-1.4 Normal IMPRESSION: Normal ankle-brachial indices. Electronically Signed   By: Acquanetta Belling M.D.   On: 04/30/2023 11:57   MR FOOT LEFT WO CONTRAST Result Date:  04/30/2023 CLINICAL DATA:  Diabetic foot infection EXAM: MRI OF THE LEFT FOOT WITHOUT CONTRAST TECHNIQUE: Multiplanar, multisequence MR imaging of the left forefoot was performed. No intravenous contrast was administered. COMPARISON:  X-ray 04/29/2023 FINDINGS: Bones/Joint/Cartilage Findings of acute osteomyelitis involving the proximal, middle, and distal phalanx of the third toe as well as the third metatarsal head and diaphysis. Intense bone marrow edema within these bones with confluent low T1 marrow signal changes. Large complex third MTP joint effusion is compatible with septic arthritis. Milder bone marrow edema within the fourth toe proximal phalanx, fourth metatarsal head as well as within the bones of the second toe, which may represent reactive osteitis or early osteomyelitis. The third MTP joint is dorsally dislocated. The great toe IP joint is in extension. No fractures. Degenerative changes are most pronounced at the second MTP joint. Ligaments Intact  Lisfranc ligament.  No acute collateral ligament injury. Muscles and Tendons Denervation changes of the foot musculature. The third toe flexor tendon is ill-defined at the level of the soft tissue ulceration and may be torn. Mild tenosynovitis. Soft tissues Soft tissue ulceration at the plantar aspect of the forefoot underlying the third MTP joint. Large complex fluid collection at the plantar aspect of the third toe and extending between the second and third toes measuring 3.6 x 2.6 x 3.3 cm. Prominent subcutaneous edema throughout the dorsum of the foot. IMPRESSION: 1. Soft tissue ulceration at the plantar aspect of the forefoot underlying the third MTP joint. Large complex fluid collection at the plantar aspect of the third toe and extending between the second and third toes measuring 3.6 x 2.6 x 3.3 cm, compatible with abscess. 2. Findings of acute osteomyelitis involving the proximal, middle, and distal phalanx of the third toe as well as the third  metatarsal head and diaphysis. Large complex third MTP joint effusion is compatible with septic arthritis. The third MTP joint is dorsally dislocated. 3. Milder bone marrow edema within the fourth toe proximal phalanx, fourth metatarsal head as well as within the bones of the second toe, which may represent reactive osteitis or early osteomyelitis. 4. The third toe flexor tendon is ill-defined at the level of the soft tissue ulceration and may be torn. Mild tenosynovitis. Electronically Signed   By: Duanne Guess D.O.   On: 04/30/2023 08:28   DG Foot Complete Left Result Date: 04/29/2023 CLINICAL DATA:  Worsening left foot infection, third digit erythema EXAM: LEFT FOOT - COMPLETE 3+ VIEW COMPARISON:  07/07/2019 FINDINGS: Frontal, oblique, and lateral views of the left foot are obtained. There is diffuse soft tissue edema, greatest in the forefoot and first through third digits. Erosive changes are seen at the base of third proximal phalanx and head of the third metatarsal consistent with acute osteomyelitis. Evidence of chronic osteomyelitis at the first interphalangeal joint and second metatarsophalangeal joints. No acute fractures. Mild midfoot osteoarthritis. IMPRESSION: 1. Diffuse soft tissue edema consistent with cellulitis. Erosive changes at the base of the third proximal phalanx and head of the third metatarsal consistent with acute osteomyelitis. 2. Sequela of chronic osteomyelitis at the first interphalangeal and second metatarsophalangeal joints. 3. Osteoarthritis. Electronically Signed   By: Sharlet Salina M.D.   On: 04/29/2023 20:20      Time coordinating discharge: over 30 minutes  SIGNED:  Sunnie Nielsen DO [facility]
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
## 2023-05-03 NOTE — Progress Notes (Signed)
Daily Progress Note   Subjective  - 1 Day Post-Op  Follow-up left foot transmetatarsal amputation.  No complaints at this time.  Objective Vitals:   05/02/23 2212 05/03/23 0100 05/03/23 0337 05/03/23 0752  BP: 135/82 113/62 105/63 127/74  Pulse: 84 76 76 80  Resp: 18 19 20 18   Temp: 97.7 F (36.5 C) 97.8 F (36.6 C) 98.2 F (36.8 C) (!) 97.3 F (36.3 C)  TempSrc: Oral  Oral   SpO2: 93% 93% 93% 96%  Weight:      Height:        Physical Exam: Dressing moved today.  Skin flaps well coapted.  No active purulence.  Erythema is minimal.  Packing removed.  Minimal bloody drainage.  Micro shows GPC's with GNR's Laboratory CBC    Component Value Date/Time   WBC 11.5 (H) 05/02/2023 0528   HGB 12.6 (L) 05/02/2023 0528   HCT 38.0 (L) 05/02/2023 0528   PLT 387 05/02/2023 0528    BMET    Component Value Date/Time   NA 136 05/02/2023 0528   NA 142 06/26/2015 1605   K 4.1 05/02/2023 0528   CL 102 05/02/2023 0528   CO2 23 05/02/2023 0528   GLUCOSE 206 (H) 05/02/2023 0528   BUN 20 05/02/2023 0528   BUN 16 06/26/2015 1605   CREATININE 1.03 05/02/2023 0528   CALCIUM 8.9 05/02/2023 0528   GFRNONAA >60 05/02/2023 0528   GFRAA >60 07/10/2019 0540    Assessment/Planning: Status post left forefoot transmetatarsal amputation for osteomyelitis Diabetes with neuropathy Diabetic foot infection  Dressing changed today.  Good perfusion of the skin flaps.  No dehiscence or stress to the incision at this time. Physical therapy to see today.  We discussed the importance of strict nonweightbearing to the left foot. Awaiting culture results.  Intraoperatively the hospital wound did not show any signs of obvious infectious process.  Will have to await final pathology results.  Currently cultures growing GPC's with GNR's. Consideration for oral antibiotics upon discharge can be performed at this time and we can always consider IV antibiotics if pathology result shows osteomyelitis of the  proximal margins. Would recommend Augmentin and Cipro upon discharge. I discussed with the patient he can change dressing in 2 to 3 days.  I would like to see him this coming week in the outpatient clinic if he is discharged this weekend.   Gwyneth Revels A  05/03/2023, 10:02 AM

## 2023-05-04 ENCOUNTER — Telehealth: Payer: Self-pay | Admitting: Osteopathic Medicine

## 2023-05-04 MED ORDER — OXYCODONE HCL 5 MG PO TABS: 5.0000 mg | ORAL_TABLET | Freq: Four times a day (QID) | ORAL | 0 refills | Status: AC | PRN

## 2023-05-04 NOTE — Telephone Encounter (Signed)
Pt called hospital - oxycodone not available at [facility] [address], asked to sent it to [facility] [address], I sent as requested and instructed Corky Downs NT (who gave me this message from the patient) to call [facility] [address] and cancel original rx

## 2023-05-05 LAB — CULTURE, BLOOD (ROUTINE X 2)
Culture: NO GROWTH
Culture: NO GROWTH

## 2023-05-07 LAB — AEROBIC/ANAEROBIC CULTURE W GRAM STAIN (SURGICAL/DEEP WOUND)

## 2023-05-07 LAB — SURGICAL PATHOLOGY
